# Patient Record
Sex: Female | Born: 1940
Health system: Southern US, Community
[De-identification: ages and names within clinical notes are randomized; demographics above are authoritative.]

## PROBLEM LIST (undated history)

## (undated) DIAGNOSIS — F411 Generalized anxiety disorder: Secondary | ICD-10-CM

## (undated) DIAGNOSIS — K589 Irritable bowel syndrome without diarrhea: Secondary | ICD-10-CM

## (undated) DIAGNOSIS — N390 Urinary tract infection, site not specified: Secondary | ICD-10-CM

## (undated) DIAGNOSIS — Z8701 Personal history of pneumonia (recurrent): Secondary | ICD-10-CM

## (undated) DIAGNOSIS — I059 Rheumatic mitral valve disease, unspecified: Secondary | ICD-10-CM

## (undated) DIAGNOSIS — M810 Age-related osteoporosis without current pathological fracture: Secondary | ICD-10-CM

## (undated) DIAGNOSIS — K209 Esophagitis, unspecified: Secondary | ICD-10-CM

## (undated) DIAGNOSIS — J069 Acute upper respiratory infection, unspecified: Secondary | ICD-10-CM

## (undated) HISTORY — PX: OTHER SURGICAL HISTORY: SHX169

## (undated) HISTORY — DX: Age-related osteoporosis without current pathological fracture: M81.0

## (undated) HISTORY — DX: Generalized anxiety disorder: F41.1

## (undated) HISTORY — DX: Rheumatic mitral valve disease, unspecified: I05.9

## (undated) HISTORY — PX: CATARACT EXTRACTION, BILATERAL: SHX1313

## (undated) HISTORY — DX: Personal history of pneumonia (recurrent): Z87.01

## (undated) HISTORY — DX: Acute upper respiratory infection, unspecified: J06.9

## (undated) HISTORY — DX: Esophagitis, unspecified: K20.9

## (undated) HISTORY — DX: Irritable bowel syndrome, unspecified: K58.9

## (undated) HISTORY — PX: MINOR EXCISION OF ORAL LESION: SHX6466

## (undated) HISTORY — DX: Gilbert syndrome: E80.4

## (undated) HISTORY — DX: Urinary tract infection, site not specified: N39.0

---

## 1998-08-06 HISTORY — PX: TOTAL ABDOMINAL HYSTERECTOMY W/ BILATERAL SALPINGOOPHORECTOMY: SHX83

## 2004-08-06 LAB — HM COLONOSCOPY: HM Colonoscopy: NORMAL

## 2006-07-11 ENCOUNTER — Encounter: Payer: Self-pay | Admitting: Internal Medicine

## 2006-08-21 ENCOUNTER — Encounter: Payer: Self-pay | Admitting: Internal Medicine

## 2006-08-21 LAB — HM MAMMOGRAPHY

## 2006-09-06 DIAGNOSIS — Z8701 Personal history of pneumonia (recurrent): Secondary | ICD-10-CM

## 2006-09-06 HISTORY — DX: Personal history of pneumonia (recurrent): Z87.01

## 2006-09-13 ENCOUNTER — Other Ambulatory Visit: Payer: Self-pay

## 2006-09-13 ENCOUNTER — Emergency Department: Payer: Self-pay | Admitting: Emergency Medicine

## 2007-03-20 ENCOUNTER — Ambulatory Visit: Payer: Self-pay | Admitting: Internal Medicine

## 2007-03-20 DIAGNOSIS — I059 Rheumatic mitral valve disease, unspecified: Secondary | ICD-10-CM | POA: Insufficient documentation

## 2007-03-20 DIAGNOSIS — F39 Unspecified mood [affective] disorder: Secondary | ICD-10-CM | POA: Insufficient documentation

## 2007-03-20 DIAGNOSIS — M810 Age-related osteoporosis without current pathological fracture: Secondary | ICD-10-CM | POA: Insufficient documentation

## 2007-03-24 ENCOUNTER — Encounter: Payer: Self-pay | Admitting: Internal Medicine

## 2007-04-28 ENCOUNTER — Encounter: Payer: Self-pay | Admitting: Internal Medicine

## 2007-06-30 DIAGNOSIS — N390 Urinary tract infection, site not specified: Secondary | ICD-10-CM | POA: Insufficient documentation

## 2007-07-07 ENCOUNTER — Ambulatory Visit: Payer: Self-pay | Admitting: Internal Medicine

## 2007-07-07 LAB — CONVERTED CEMR LAB
Glucose, Urine, Semiquant: NEGATIVE
Protein, U semiquant: NEGATIVE
Specific Gravity, Urine: <1.005
Urobilinogen, UA: 0.2

## 2007-07-17 ENCOUNTER — Encounter: Payer: Self-pay | Admitting: Internal Medicine

## 2007-07-28 ENCOUNTER — Ambulatory Visit: Payer: Self-pay | Admitting: Internal Medicine

## 2007-10-21 ENCOUNTER — Telehealth (INDEPENDENT_AMBULATORY_CARE_PROVIDER_SITE_OTHER): Payer: Self-pay | Admitting: *Deleted

## 2007-11-17 ENCOUNTER — Ambulatory Visit: Payer: Self-pay | Admitting: Family Medicine

## 2007-11-17 LAB — CONVERTED CEMR LAB
Bacteria, UA: 0
Bilirubin Urine: NEGATIVE
Blood in Urine, dipstick: NEGATIVE
Specific Gravity, Urine: <1.005
Urobilinogen, UA: 0.2
WBC Urine, dipstick: NEGATIVE
pH: 6.5

## 2007-11-18 ENCOUNTER — Encounter (INDEPENDENT_AMBULATORY_CARE_PROVIDER_SITE_OTHER): Payer: Self-pay | Admitting: Internal Medicine

## 2007-12-04 ENCOUNTER — Encounter: Payer: Self-pay | Admitting: Internal Medicine

## 2007-12-10 ENCOUNTER — Encounter (INDEPENDENT_AMBULATORY_CARE_PROVIDER_SITE_OTHER): Payer: Self-pay | Admitting: Internal Medicine

## 2007-12-16 ENCOUNTER — Encounter: Payer: Self-pay | Admitting: Internal Medicine

## 2008-03-15 ENCOUNTER — Ambulatory Visit: Payer: Self-pay | Admitting: Family Medicine

## 2008-03-15 LAB — CONVERTED CEMR LAB
Bilirubin Urine: NEGATIVE
Blood in Urine, dipstick: NEGATIVE
Glucose, Urine, Semiquant: NEGATIVE
Ketones, urine, test strip: NEGATIVE
Nitrite: NEGATIVE
Protein, U semiquant: NEGATIVE
Specific Gravity, Urine: <1.005
Urobilinogen, UA: 0.2
pH: 7.5

## 2008-03-19 ENCOUNTER — Telehealth: Payer: Self-pay | Admitting: Family Medicine

## 2008-03-20 ENCOUNTER — Encounter: Payer: Self-pay | Admitting: Family Medicine

## 2008-09-19 IMAGING — CR DG CHEST 2V
1 series · 2 of 2 positions shown · non-contrast
Comparison: none

REASON FOR EXAM: chest pain/cough
COMMENTS:

PROCEDURE:     DXR - DXR CHEST PA (OR AP) AND LATERAL  - September 14, 2006  [DATE]
RESULT:     The mediastinum and hilar structures are normal.  The lungs are
clear.  Biapical pleural thickening is noted consistent with scarring.

[Series 1: view not recorded · 0.17mm/px · 2 of 2 slices shown]
[im 1/2]
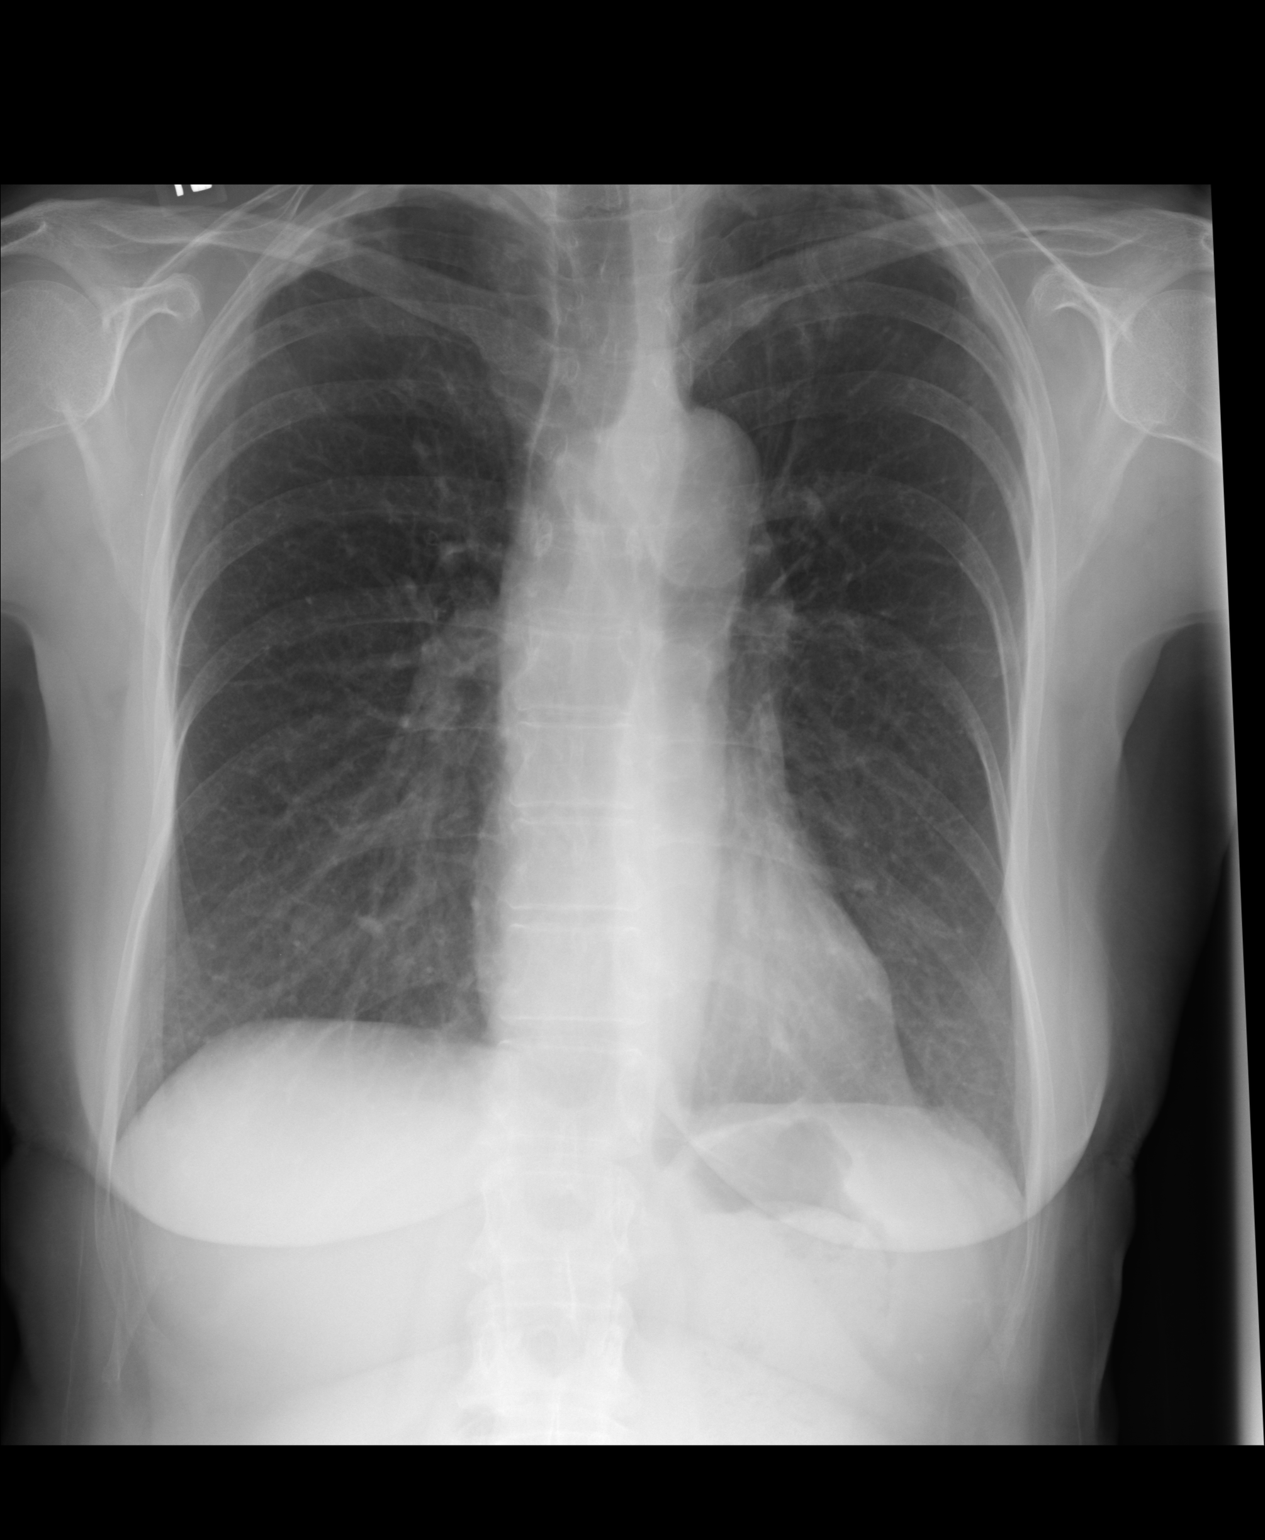
[im 2/2]
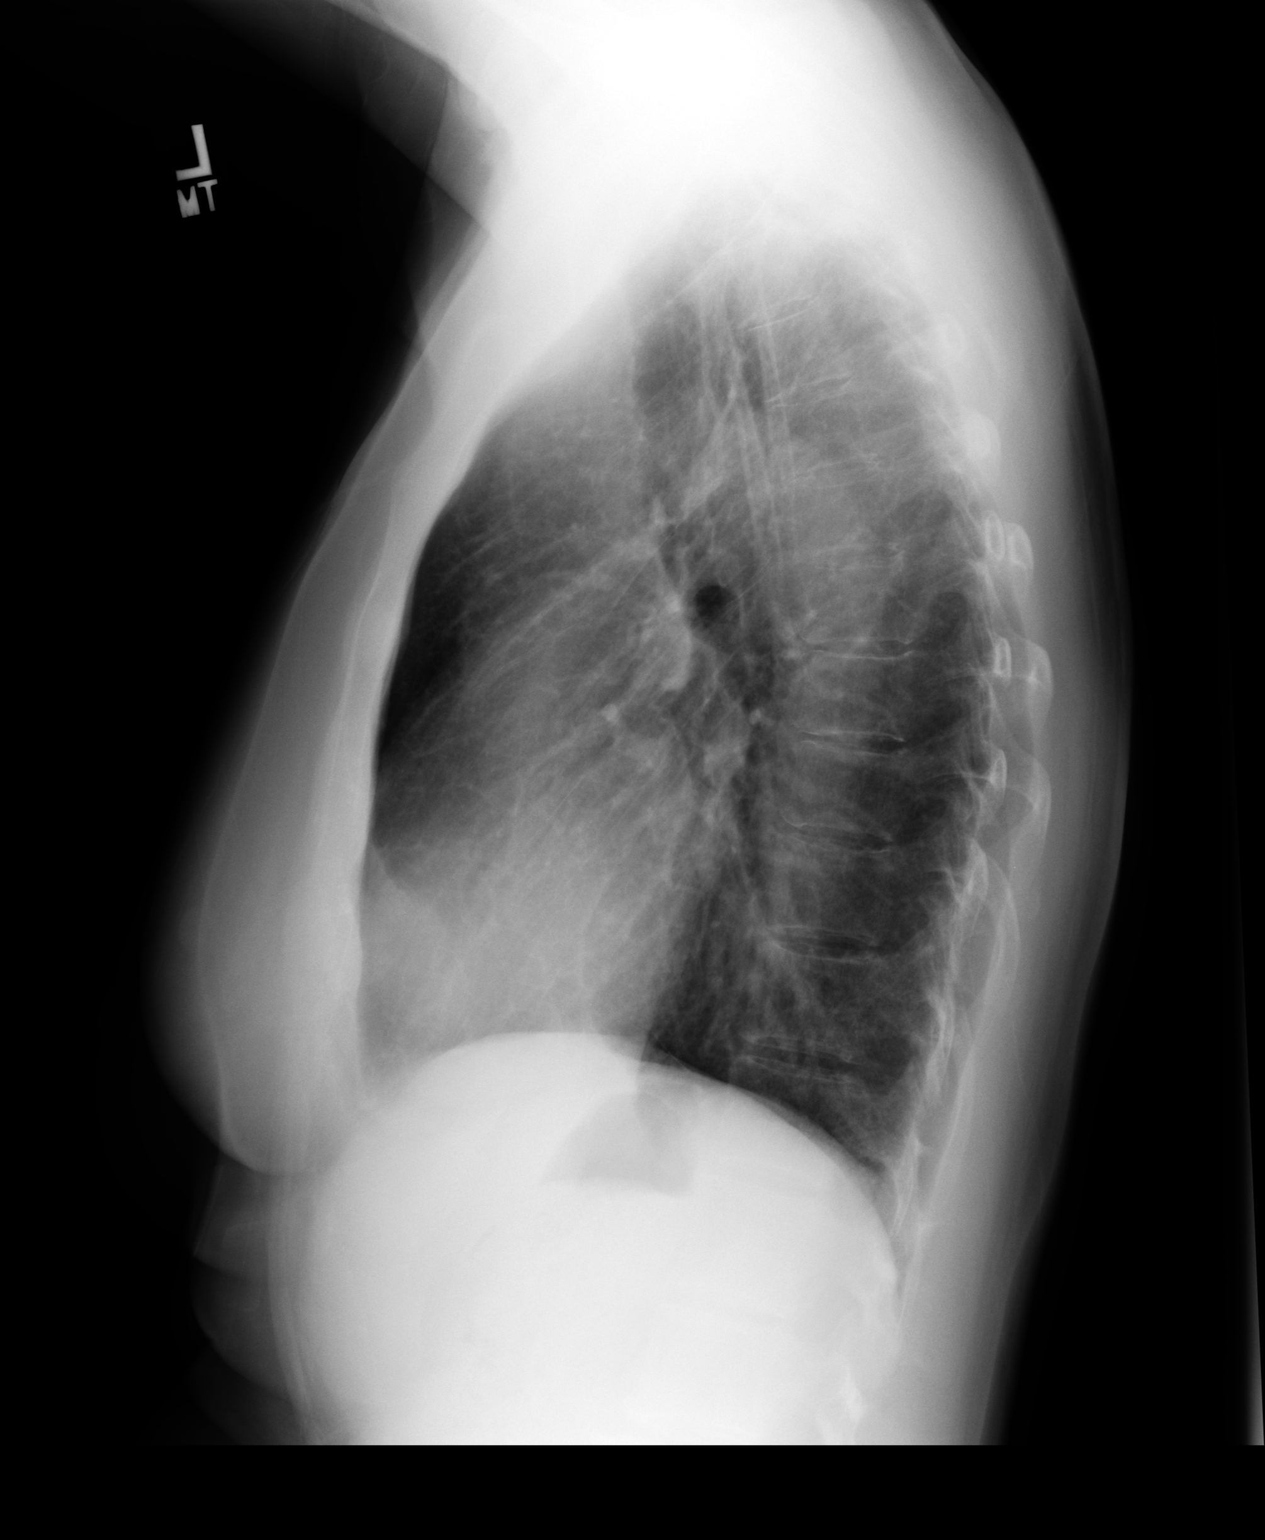

[2 of 2 positions shown; findings below may reference images not displayed]

IMPRESSION: No acute cardiopulmonary disease.

## 2009-03-01 ENCOUNTER — Ambulatory Visit: Payer: Self-pay | Admitting: Unknown Physician Specialty

## 2009-06-14 ENCOUNTER — Ambulatory Visit: Payer: Self-pay | Admitting: Family Medicine

## 2009-07-25 ENCOUNTER — Ambulatory Visit: Payer: Self-pay | Admitting: Internal Medicine

## 2009-07-25 DIAGNOSIS — J069 Acute upper respiratory infection, unspecified: Secondary | ICD-10-CM | POA: Insufficient documentation

## 2009-09-08 LAB — LIPID PANEL: LDL Cholesterol: 93 mg/dL

## 2010-05-12 ENCOUNTER — Telehealth: Payer: Self-pay | Admitting: Family Medicine

## 2010-09-05 NOTE — Progress Notes (Signed)
Summary: cold sores  Phone Note Call from Patient Call back at Home Phone [phone]   Caller: Patient Call For: Kerby Nora MD Summary of Call: Patient says that she has a few cold sores and is asking if she could get a rx for them called in to [facility] on [address]. Please advise.  Initial call taken by: Melody Comas,  May 12, 2010 9:22 AM  Follow-up for Phone Call        fever blisters on lips or cold sores in mouth ? Is she requesting a particular med? Follow-up by: Kerby Nora MD,  May 12, 2010 9:32 AM  Additional Follow-up for Phone Call Additional follow up Details #1::        Patient said that she has fever blisters on her lips and she is requesting zoviraz cream. She has used this in the past and really worked well for her.  Additional Follow-up by: Melody Comas,  May 12, 2010 11:11 AM    New/Updated Medications: ZOVIRAX 5 % OINT (ACYCLOVIR) Apply 6 times a day for 7 days Prescriptions: ZOVIRAX 5 % OINT (ACYCLOVIR) Apply 6 times a day for 7 days  #1 x 1   Entered and Authorized by:   Kerby Nora MD   Signed by:   Kerby Nora MD on 05/12/2010   Method used:   Electronically to        [facility] (retail)       [address], [address]       [place], [place]  [address]       Ph: [phone]       Fax: [phone]   RxID:   [id number]

## 2010-10-26 ENCOUNTER — Encounter: Payer: Self-pay | Admitting: Internal Medicine

## 2010-10-27 ENCOUNTER — Ambulatory Visit: Payer: Self-pay | Admitting: Family Medicine

## 2010-12-19 NOTE — Assessment & Plan Note (Signed)
[facility]                                 ON-CALL NOTE   NAME:Musto, SHADA                        MRN:          [id number]  DATE:07/06/2007                            DOB:          [birth year]    CALLER:  Patient's husband.   TIME OF CALL:  5:49pm   TELEPHONE NUMBER:  [phone]   REGULAR DOCTOR:  Dr. Alphonsus Sias.   CHIEF COMPLAINT:  UTI.   The patient's husband says that she has had a history of UTIs in the  past. They moved here recently from [place] and she had some old  Cipro left over. She is complaining of dysuria, frequency, and urgency.  There is no hematuria and no back pain, fever, nausea, or vomiting. She  has taken two days of Cipro and has not felt any relief yet and they  wanted to know what to do. I told them to go ahead and have her take the  third day of Cipro and then to call the office in the morning for follow  up, so she can give urine for a culture sensitivity and be seen. I  explained that she may have a urinary tract infection that is resistant  to Cipro, or just has not responded yet. I told her to increase her  fluid intake and that if she develops back pain, fever, nausea,  vomiting, or worsening symptoms tonight to go the emergency room.     Marne A. Tower, MD  Electronically Signed    MAT/MedQ  DD: 07/06/2007  DT: 07/07/2007  Job #: [id number]

## 2011-01-25 ENCOUNTER — Ambulatory Visit: Payer: Self-pay | Admitting: Ophthalmology

## 2011-04-10 ENCOUNTER — Ambulatory Visit: Payer: Self-pay | Admitting: Ophthalmology

## 2011-06-04 ENCOUNTER — Ambulatory Visit (INDEPENDENT_AMBULATORY_CARE_PROVIDER_SITE_OTHER): Payer: Medicare Other | Admitting: Family Medicine

## 2011-06-04 ENCOUNTER — Encounter: Payer: Self-pay | Admitting: Family Medicine

## 2011-06-04 VITALS — BP 146/84 | HR 88 | Temp 97.8°F | Wt 155.0 lb

## 2011-06-04 DIAGNOSIS — Z23 Encounter for immunization: Secondary | ICD-10-CM

## 2011-06-04 DIAGNOSIS — N390 Urinary tract infection, site not specified: Secondary | ICD-10-CM

## 2011-06-04 DIAGNOSIS — R300 Dysuria: Secondary | ICD-10-CM

## 2011-06-04 LAB — POCT URINALYSIS DIPSTICK
Bilirubin, UA: NEGATIVE
Nitrite, UA: NEGATIVE
Protein, UA: NEGATIVE
pH, UA: 6.5

## 2011-06-04 MED ORDER — SULFAMETHOXAZOLE-TRIMETHOPRIM 800-160 MG PO TABS: 1.0000 | ORAL_TABLET | Freq: Two times a day (BID) | ORAL | Status: AC

## 2011-06-04 NOTE — Patient Instructions (Signed)
Drink plenty of water and start the antibiotics today.  Take care.    

## 2011-06-04 NOTE — Progress Notes (Signed)
H/o frequent UTIs.  Prev on macrobid in 11/2010 Dysuria: yes, frequency, minimal voiding per episode, pain with urination duration of symptoms: 4-5 days abdominal pain: no  fevers:no back pain:no vomiting:no  Meds, vitals, and allergies reviewed.   ROS: See HPI.  Otherwise negative.    GEN: nad, alert and oriented HEENT: mucous membranes moist NECK: supple CV: rrr.  PULM: ctab, no inc wob ABD: soft, +bs, suprapubic area not tender EXT: no edema SKIN: no acute rash BACK: no CVA pain

## 2011-06-05 ENCOUNTER — Telehealth: Payer: Self-pay | Admitting: *Deleted

## 2011-06-05 NOTE — Telephone Encounter (Signed)
This should be okay to take.

## 2011-06-05 NOTE — Telephone Encounter (Signed)
Advised pt

## 2011-06-05 NOTE — Assessment & Plan Note (Signed)
Septra, ucx and fluids.  Nontoxic, no sign of pyelo, f/u prn.  See notes on labs.

## 2011-06-05 NOTE — Telephone Encounter (Signed)
Pt was seen yesterday and given septra for a UTI.  She was also given a flu shot and asks if ok to take the septra since the just got the vaccine.  Her septra patient information sheet said not to take it if she has had recent vaccines.

## 2011-06-08 ENCOUNTER — Encounter: Payer: Self-pay | Admitting: Internal Medicine

## 2011-06-08 LAB — URINE CULTURE

## 2011-06-18 ENCOUNTER — Telehealth: Payer: Self-pay | Admitting: *Deleted

## 2011-06-18 NOTE — Telephone Encounter (Signed)
Pt's husband called back, said it's time for pt to follow up with urologist, she will make appt there.

## 2011-06-18 NOTE — Telephone Encounter (Signed)
noted 

## 2011-06-18 NOTE — Telephone Encounter (Signed)
Pt has been taking amox for UTI but her husband says this isnt helping. She is still having symptoms- feeling like she's not emptying her bladder, feels like she has to go all the time, feels irritated.  Pt does have a urologist.  Please advise on what she should do. Uses [facility] [address].

## 2011-08-21 ENCOUNTER — Ambulatory Visit: Payer: Self-pay | Admitting: Ophthalmology

## 2011-10-31 ENCOUNTER — Ambulatory Visit: Payer: Medicare Other | Admitting: Cardiovascular Disease

## 2011-11-21 ENCOUNTER — Ambulatory Visit (INDEPENDENT_AMBULATORY_CARE_PROVIDER_SITE_OTHER): Payer: Medicare Other | Admitting: Internal Medicine

## 2011-11-21 ENCOUNTER — Encounter: Payer: Self-pay | Admitting: Internal Medicine

## 2011-11-21 VITALS — BP 128/80 | HR 80 | Temp 97.9°F | Ht 65.0 in | Wt 145.0 lb

## 2011-11-21 DIAGNOSIS — Z0000 Encounter for general adult medical examination without abnormal findings: Secondary | ICD-10-CM | POA: Insufficient documentation

## 2011-11-21 DIAGNOSIS — N390 Urinary tract infection, site not specified: Secondary | ICD-10-CM

## 2011-11-21 DIAGNOSIS — I059 Rheumatic mitral valve disease, unspecified: Secondary | ICD-10-CM

## 2011-11-21 DIAGNOSIS — M810 Age-related osteoporosis without current pathological fracture: Secondary | ICD-10-CM

## 2011-11-21 DIAGNOSIS — F411 Generalized anxiety disorder: Secondary | ICD-10-CM

## 2011-11-21 DIAGNOSIS — K589 Irritable bowel syndrome without diarrhea: Secondary | ICD-10-CM

## 2011-11-21 LAB — CBC WITH DIFFERENTIAL/PLATELET
Basophils Absolute: 0.1 10*3/uL (ref 0.0–0.1)
Eosinophils Absolute: 0.1 10*3/uL (ref 0.0–0.7)
Lymphocytes Relative: 29.8 % (ref 12.0–46.0)
MCHC: 32.7 g/dL (ref 30.0–36.0)
Neutrophils Relative %: 59.9 % (ref 43.0–77.0)
Platelets: 226 10*3/uL (ref 150.0–400.0)
RBC: 4.56 Mil/uL (ref 3.87–5.11)
RDW: 14.1 % (ref 11.5–14.6)

## 2011-11-21 LAB — BASIC METABOLIC PANEL
CO2: 27 mEq/L (ref 19–32)
Chloride: 106 mEq/L (ref 96–112)
Potassium: 4 mEq/L (ref 3.5–5.1)
Sodium: 142 mEq/L (ref 135–145)

## 2011-11-21 LAB — HEPATIC FUNCTION PANEL
Bilirubin, Direct: 0.1 mg/dL (ref 0.0–0.3)
Total Bilirubin: 1 mg/dL (ref 0.3–1.2)

## 2011-11-21 LAB — TSH: TSH: 1.72 u[IU]/mL (ref 0.35–5.50)

## 2011-11-21 NOTE — Assessment & Plan Note (Signed)
Palpitations are not as evident Feeling of fast heart at times No click or murmur on exam Plans to see cardiologist

## 2011-11-21 NOTE — Progress Notes (Signed)
  Subjective:    Patient ID: Denise Rhodes, female    DOB: 1940/12/23, 71 y.o.   MRN: [id number]  HPI Here for Medicare wellness No depression or anhedonia No falls or instability Reviewed advanced directives Mild hearing problems---no real functional issues  Has had some anxiety Normal life stress does give her some anxiety issues (more than average) Has used alprazolam rarely  Has mitral valve prolapse Husband concerned about her blood pressure--but normal here No palpitations --did have years ago Does note fast pulse rate at times---then doesn't feel right. Rate in 90's No dizziness or syncope No edema  Unable to tolerate calcium in pills or much dairy due to IBS Not exercising--discussed starting to walk  No current outpatient prescriptions on file prior to visit.    No Known Allergies  Past Medical History  Diagnosis Date  . Anxiety state, unspecified   . Mitral valve disorders   . Osteoporosis, unspecified   . Acute upper respiratory infections of unspecified site   . Urinary tract infection, site not specified   . Gilbert's syndrome   . H/O: pneumonia 09/2006    Past Surgical History  Procedure Date  . Total abdominal hysterectomy w/ bilateral salpingoophorectomy 2000  . Cataract extraction, bilateral 9/12, 1/13    Family History  Problem Relation Age of Onset  . Cancer Mother     Colon  . Cancer Father     Lung  . Diabetes      Paternal side  . Coronary artery disease Maternal Grandmother   . Coronary artery disease Paternal Grandmother   . Hypertension Mother     History   Social History  . Marital Status: Married    Spouse Name: N/A    Number of Children: 3  . Years of Education: N/A   Occupational History  . 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 Smoking status: Never Smoker   . Smokeless tobacco: Never Used  . Alcohol Use: Yes     Rare  . Drug Use: Not on file  . Sexually Active: Not on file   Other  Topics Concern  . Not on file   Social History Narrative   No living will Requests husband as health care POAWould accept resuscitation attempts.Would not want tube feeds if cognitively unaware   Review of Systems Sleeps fairly well--best 4-8AM Weight is stable Bowels are variable--upset stomach at times. Better with citrucel at night and proper eating     Objective:   Physical Exam  Constitutional: She is oriented to person, place, and time. She appears well-developed and well-nourished. No distress.  Neck: Normal range of motion. Neck supple. No thyromegaly present.  Cardiovascular: Normal rate, regular rhythm, normal heart sounds and intact distal pulses.  Exam reveals no gallop.   No murmur heard. Pulmonary/Chest: Effort normal and breath sounds normal. No respiratory distress. She has no wheezes. She has no rales.  Abdominal: Soft. Bowel sounds are normal. There is no tenderness.  Musculoskeletal: She exhibits no edema and no tenderness.  Lymphadenopathy:    She has no cervical adenopathy.  Neurological: She is alert and oriented to person, place, and time.       Pollie Friar, [place]" [phone] D-[id number] Recall 3/3  Psychiatric: She has a normal mood and affect. Her behavior is normal. Thought content normal.          Assessment & Plan:

## 2011-11-21 NOTE — Assessment & Plan Note (Signed)
Ongoing issue Does well without meds generally Rare xanax

## 2011-11-21 NOTE — Assessment & Plan Note (Signed)
Asked her to start walking and take vitamin D Can't tolerate calcium

## 2011-11-21 NOTE — Assessment & Plan Note (Signed)
I have personally reviewed the Medicare Annual Wellness questionnaire and have noted 1. The patient's medical and social history 2. Their use of alcohol, tobacco or illicit drugs 3. Their current medications and supplements 4. The patient's functional ability including ADL's, fall risks, home safety risks and hearing or visual             impairment. 5. Diet and physical activities 6. Evidence for depression or mood disorders  The patients weight, height, BMI and visual acuity have been recorded in the chart I have made referrals, counseling and provided education to the patient based review of the above and I have provided the pt with a written personalized care plan for preventive services.  I have provided you with a copy of your personalized plan for preventive services. Please take the time to review along with your updated medication list.  Will defer zostavax--had shingles last August Gets mammos through gyn

## 2011-11-21 NOTE — Assessment & Plan Note (Signed)
Better with some dietary changes

## 2011-11-21 NOTE — Assessment & Plan Note (Signed)
Recurrent  Sees gyn specialist

## 2011-11-26 ENCOUNTER — Encounter: Payer: Self-pay | Admitting: *Deleted

## 2011-12-03 ENCOUNTER — Ambulatory Visit: Payer: Medicare Other | Admitting: Cardiovascular Disease

## 2011-12-21 ENCOUNTER — Ambulatory Visit: Payer: Medicare Other | Admitting: Cardiovascular Disease

## 2012-01-15 ENCOUNTER — Ambulatory Visit (INDEPENDENT_AMBULATORY_CARE_PROVIDER_SITE_OTHER): Payer: Medicare Other | Admitting: Cardiovascular Disease

## 2012-01-15 ENCOUNTER — Encounter: Payer: Self-pay | Admitting: Cardiovascular Disease

## 2012-01-15 VITALS — BP 118/66 | HR 76 | Ht 65.0 in | Wt 147.8 lb

## 2012-01-15 DIAGNOSIS — R002 Palpitations: Secondary | ICD-10-CM

## 2012-01-15 DIAGNOSIS — I059 Rheumatic mitral valve disease, unspecified: Secondary | ICD-10-CM

## 2012-01-15 DIAGNOSIS — I341 Nonrheumatic mitral (valve) prolapse: Secondary | ICD-10-CM

## 2012-01-15 NOTE — Assessment & Plan Note (Signed)
Rare palpitations. Will plan further workup if frequency changes.

## 2012-01-15 NOTE — Progress Notes (Signed)
   History of Present Illness: 71 yo WF with history of anxiety,  mitral valve prolapse, IBS who is here today for evaluation of palpitations. She tells me that she was diagnosed with mitral valve prolapse many years ago. She tells me that she had some heart burn 2 months ago but this has resolved. She does feel her heart racing at times every few months. No SOB. No chest pain over last few months.   Primary Care Physician: Dr. Alphonsus Sias  Past Medical History  Diagnosis Date  . Anxiety state, unspecified   . Mitral valve disorders   . Osteoporosis, unspecified   . Acute upper respiratory infections of unspecified site   . Urinary tract infection, site not specified   . Gilbert's syndrome   . H/O: pneumonia 09/2006  . IBS (irritable bowel syndrome)     Past Surgical History  Procedure Date  . Total abdominal hysterectomy w/ bilateral salpingoophorectomy 2000  . Cataract extraction, bilateral 9/12, 1/13  . Bladder tack     Current Outpatient Prescriptions  Medication Sig Dispense Refill  . ALPRAZolam (XANAX) 0.25 MG tablet Take 0.25 mg by mouth 3 (three) times daily as needed.      . magnesium 30 MG tablet Take 250 mg by mouth daily.        No Known Allergies  History   Social History  . Marital Status: Married    Spouse Name: N/A    Number of Children: 3  . Years of Education: N/A   Occupational History  . 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 Smoking status: Never Smoker   . Smokeless tobacco: Never Used  . Alcohol Use: No     Rare  . Drug Use: No  . Sexually Active: Not on file   Other Topics Concern  . Not on file   Social History Narrative   No living will Requests husband as health care POAWould accept resuscitation attempts.Would not want tube feeds if cognitively unaware    Family History  Problem Relation Age of Onset  . Cancer Mother     Colon  . Cancer Father     Lung  . Diabetes      Paternal side  . Coronary  artery disease Maternal Grandmother   . Coronary artery disease Paternal Grandmother   . Hypertension Mother     Review of Systems:  As stated in the HPI and otherwise negative.   BP 118/66  Pulse 76  Ht 5\' 5"  (1.651 m)  Wt 147 lb 12.8 oz (67.042 kg)  BMI 24.60 kg/m2  Physical Examination: General: Well developed, well nourished, NAD HEENT: OP clear, mucus membranes moist SKIN: warm, dry. No rashes. Neuro: No focal deficits Musculoskeletal: Muscle strength 5/5 all ext Psychiatric: Mood and affect normal Neck: No JVD, no carotid bruits, no thyromegaly, no lymphadenopathy. Lungs:Clear bilaterally, no wheezes, rhonci, crackles Cardiovascular: Regular rate and rhythm. No murmurs, gallops or rubs. Abdomen:Soft. Bowel sounds present. Non-tender.  Extremities: No lower extremity edema. Pulses are 2 + in the bilateral DP/PT.  EKG: NSR, rate 76 bpm.

## 2012-01-15 NOTE — Patient Instructions (Signed)

## 2012-01-15 NOTE — Assessment & Plan Note (Signed)
Will get echo to evaluate. No further workup.

## 2012-01-22 ENCOUNTER — Ambulatory Visit (HOSPITAL_COMMUNITY): Payer: Medicare Other | Attending: Cardiovascular Disease | Admitting: Radiology

## 2012-01-22 DIAGNOSIS — I519 Heart disease, unspecified: Secondary | ICD-10-CM | POA: Insufficient documentation

## 2012-01-22 DIAGNOSIS — R002 Palpitations: Secondary | ICD-10-CM | POA: Insufficient documentation

## 2012-01-22 DIAGNOSIS — I059 Rheumatic mitral valve disease, unspecified: Secondary | ICD-10-CM

## 2012-01-22 DIAGNOSIS — I341 Nonrheumatic mitral (valve) prolapse: Secondary | ICD-10-CM

## 2012-01-22 NOTE — Progress Notes (Signed)
Echocardiogram performed.  

## 2012-06-02 ENCOUNTER — Ambulatory Visit (INDEPENDENT_AMBULATORY_CARE_PROVIDER_SITE_OTHER): Payer: Medicare Other

## 2012-06-02 DIAGNOSIS — Z23 Encounter for immunization: Secondary | ICD-10-CM

## 2013-05-15 ENCOUNTER — Ambulatory Visit (INDEPENDENT_AMBULATORY_CARE_PROVIDER_SITE_OTHER): Payer: Medicare Other

## 2013-05-15 ENCOUNTER — Ambulatory Visit: Payer: Medicare Other

## 2013-05-15 DIAGNOSIS — Z23 Encounter for immunization: Secondary | ICD-10-CM

## 2013-05-21 ENCOUNTER — Ambulatory Visit (INDEPENDENT_AMBULATORY_CARE_PROVIDER_SITE_OTHER): Payer: Medicare Other | Admitting: Physician Assistant

## 2013-05-21 ENCOUNTER — Telehealth: Payer: Self-pay | Admitting: Cardiovascular Disease

## 2013-05-21 ENCOUNTER — Encounter: Payer: Self-pay | Admitting: Physician Assistant

## 2013-05-21 VITALS — BP 161/94 | HR 99 | Ht 65.0 in | Wt 149.2 lb

## 2013-05-21 DIAGNOSIS — R002 Palpitations: Secondary | ICD-10-CM

## 2013-05-21 DIAGNOSIS — R000 Tachycardia, unspecified: Secondary | ICD-10-CM

## 2013-05-21 DIAGNOSIS — I059 Rheumatic mitral valve disease, unspecified: Secondary | ICD-10-CM

## 2013-05-21 MED ORDER — PROPRANOLOL HCL 40 MG PO TABS: 40.0000 mg | ORAL_TABLET | ORAL | Status: DC | PRN

## 2013-05-21 MED ORDER — PROPRANOLOL HCL 20 MG PO TABS: 20.0000 mg | ORAL_TABLET | ORAL | Status: DC | PRN

## 2013-05-21 NOTE — Assessment & Plan Note (Signed)
The patient reports increased frequency and prolonged duration of palpitations over the past week. Will check CBC, CMET and TFTs to evaluate for secondary causes. Start propanolol PRN. Arrange 48 hour Holter monitor. Guide management based on findings. EKG indicates an isolated PAC today. Palpitations may be attributed to ectopy. No significant structural cardiac changes on echo last year. Suspect anxiety is playing a significant role.

## 2013-05-21 NOTE — Telephone Encounter (Signed)
New message    Last night pulse dropped to 50 and  B/p was 166/95.  Pulse is normally in the 80's.  Husband needs advice.

## 2013-05-21 NOTE — Progress Notes (Signed)
Patient ID: Denise Rhodes, female   DOB: [birth year], 72 y.o.   MRN: [id number]   Date:  05/21/2013   ID:  Denise Rhodes, DOB 08-10-1940, MRN [id number]  PCP:  Tillman Abide, MD  Primary Cardiologist:  C. Clifton James, MD   History of Present Illness:  Denise Rhodes is a 72 y.o. female w/ PMHx s/f anxiety, IBS and palpitations who presents today c/o worsening palpitations.  She followed up with Dr. Clifton James 01/2012 for palpitations. She reported a history of MVP at that time diagnosed many years prior. Follow-up echo indicated EF 55-60%, grade 1 diastolic dysfunction, structurally normal MV with normal leaflet separation, trivial MR. Further work-up for palpitations if change in frequency recommended.  She has not followed up since. She presents to the office today complaining of both tachy-palpitations and palpitations described as "skipping a beat" occurring more frequently (almost daily) and for longer periods over the past week. She personally relates this, at least partially, to anxiety. She denies increased stress. No excessive EtOH or caffeine use. Denies fevers, chills, abnormal bleeding. No h/o thyroid abnormalities, anemia or sleep apnea. Denies chest pain, SOB/DOE, PND, orthopnea, LE edema, weight gain, lightheadedness or syncope.   EKG: NSR, isolated PAC, borderline LAE, no ST/T changes  Wt Readings from Last 3 Encounters:  05/21/13 149 lb 4 oz (67.699 kg)  01/15/12 147 lb 12.8 oz (67.042 kg)  11/21/11 145 lb (65.772 kg)     Past Medical History  Diagnosis Date  . Anxiety state, unspecified   . Mitral valve disorders   . Osteoporosis, unspecified   . Acute upper respiratory infections of unspecified site   . Urinary tract infection, site not specified   . Gilbert's syndrome   . H/O: pneumonia 09/2006  . IBS (irritable bowel syndrome)     Current Outpatient Prescriptions  Medication Sig Dispense Refill  . ALPRAZolam (XANAX) 0.25 MG tablet Take 0.25 mg by mouth 3 (three) times  daily as needed.      . magnesium 30 MG tablet Take 250 mg by mouth daily.       No current facility-administered medications for this visit.    Allergies:   No Known Allergies  Social History:  The patient  reports that she has never smoked. She has never used smokeless tobacco. She reports that she does not drink alcohol or use illicit drugs.   Family History:  Family History  Problem Relation Age of Onset  . Cancer Mother     Colon  . Cancer Father     Lung  . Diabetes      Paternal side  . Coronary artery disease Maternal Grandmother   . Coronary artery disease Paternal Grandmother   . Hypertension Mother     Review of Systems: General: negative for chills, fever, night sweats or weight changes.  Cardiovascular: positive for palpitations, negative for chest pain, dyspnea on exertion, edema, orthopnea, paroxysmal nocturnal dyspnea or shortness of breath Dermatological: negative for rash Respiratory: negative for cough or wheezing Urologic: negative for hematuria Abdominal: negative for nausea, vomiting, diarrhea, bright red blood per rectum, melena, or hematemesis Neurologic: negative for visual changes, syncope, or dizziness All other systems reviewed and are otherwise negative except as noted above.  PHYSICAL EXAM: VS:  BP 161/94  Pulse 99  Ht 5\' 5"  (1.651 m)  Wt 149 lb 4 oz (67.699 kg)  BMI 24.84 kg/m2 Well nourished, well developed, in no acute distress. HEENT: normal, PERRL Neck: no JVD or bruits Cardiac:  normal S1, S2;  RRR; no murmur, clicks or gallops; no extra-systoles Lungs:  clear to auscultation bilaterally, no wheezing, rhonchi or rales Abd: soft, nontender, no hepatomegaly, normoactive BS x 4 quads Ext: no edema, cyanosis or clubbing Skin: warm and dry, cap refill < 2 sec Neuro:  CNs 2-12 intact, no focal abnormalities noted Musculoskeletal: strength and tone appropriate for age  Psych: anxious appearing, pressured speech at times

## 2013-05-21 NOTE — Telephone Encounter (Signed)
Agree. cdm 

## 2013-05-21 NOTE — Telephone Encounter (Signed)
Spoke with pt's husband. He reports pt felt tired last night and felt like she had a fast pulse rate. He said when he checked her heart rate it was 50 and blood pressure was 170/95. Later checked and it was 138/82 with pulse of 84.  Husband reports wife has had a few episodes of feeling like pulse is elevated recently. She took tranquilizer last night and slept well.  She is feeling good this morning. Has not checked blood pressure this morning. Does not feel like heart rate is elevated. Pt has appt with Dr. Clifton James on May 27, 2013.  I advised husband that pt should keep this appt and to let us know if episodes increase prior to this appt. Instructed to go to ED if symptoms worsen.

## 2013-05-21 NOTE — Patient Instructions (Addendum)
Please take propranolol as prescribed. Please monitor your BP with this.   We will obtain labwork today as discussed to check for secondary causes for your palpitations.   Continue to take Xanax as needed for anxiety.   We will arrange 48 hour Holter monitoring to check for arrhythmia.   We will see you back as needed after reviewing the Holter findings.

## 2013-05-21 NOTE — Assessment & Plan Note (Signed)
No evidence of click on exam. Normal MV on echo last year.

## 2013-05-22 ENCOUNTER — Telehealth: Payer: Self-pay

## 2013-05-22 ENCOUNTER — Telehealth: Payer: Self-pay | Admitting: *Deleted

## 2013-05-22 LAB — COMPREHENSIVE METABOLIC PANEL
AST: 15 IU/L (ref 0–40)
Albumin/Globulin Ratio: 1.9 (ref 1.1–2.5)
Albumin: 4.2 g/dL (ref 3.5–4.8)
BUN: 14 mg/dL (ref 8–27)
CO2: 25 mmol/L (ref 18–29)
Calcium: 9.4 mg/dL (ref 8.6–10.2)
Creatinine, Ser: 0.68 mg/dL (ref 0.57–1.00)
GFR calc Af Amer: 101 mL/min/{1.73_m2} (ref >59)
Globulin, Total: 2.2 g/dL (ref 1.5–4.5)
Glucose: 169 mg/dL — ABNORMAL HIGH (ref 65–99)
Potassium: 4.5 mmol/L (ref 3.5–5.2)
Sodium: 143 mmol/L (ref 134–144)
Total Bilirubin: 1 mg/dL (ref 0.0–1.2)
Total Protein: 6.4 g/dL (ref 6.0–8.5)

## 2013-05-22 LAB — T4, FREE: Free T4: 1.37 ng/dL (ref 0.82–1.77)

## 2013-05-22 LAB — CBC
Platelets: 262 10*3/uL (ref 150–379)
RBC: 4.43 x10E6/uL (ref 3.77–5.28)
RDW: 13.4 % (ref 12.3–15.4)
WBC: 6.9 10*3/uL (ref 3.4–10.8)

## 2013-05-22 NOTE — Telephone Encounter (Signed)
Message copied by Marilynne Halsted on Fri May 22, 2013  3:00 PM ------      Message from: Denise Rhodes      Created: Fri May 22, 2013  9:52 AM       CMET, CBC and TFTs all WNL. Please update patient. Thx ------

## 2013-05-22 NOTE — Telephone Encounter (Signed)
Line busy. Will try again.

## 2013-05-22 NOTE — Telephone Encounter (Signed)
They have a question about the instructions for the medication that was prescribed yesterday.  Please call the daughter back at [phone]

## 2013-05-22 NOTE — Telephone Encounter (Signed)
Spoke w/ pt's husband.  He will make pt aware of results and have her call with any questions or concerns.

## 2013-05-22 NOTE — Telephone Encounter (Signed)
Spoke w/ pt's husband.  States that they picked up rx from pharmacy and were given 2 strengths of propranolol: 20mg  & 40mg . Informed him that our notes indicate that pt is on 20 mg and I have no record of propranolol 40mg . He will contact the pharmacy and let me know if I need to speak with them.

## 2013-05-27 ENCOUNTER — Ambulatory Visit: Payer: Medicare Other | Admitting: Cardiovascular Disease

## 2013-06-01 ENCOUNTER — Telehealth: Payer: Self-pay

## 2013-06-01 NOTE — Telephone Encounter (Signed)
Pt husband would like holter monitor results.

## 2013-06-02 NOTE — Telephone Encounter (Signed)
I spoke with Denise Rhodes in the [place] office. She states she has the patient's monitor to be downloaded. I spoke with the patient's husband and he is aware. I advised we will call him back when the holter is received. Per the patient's husband, she experiences "symptoms" about 2:30- 3:00 pm every day and has to take a propranolol. He states the patient did have the same symptoms but later in the day while wearing her monitor. She documented this in her log.

## 2013-06-05 ENCOUNTER — Telehealth: Payer: Self-pay | Admitting: Cardiovascular Disease

## 2013-06-05 ENCOUNTER — Telehealth: Payer: Self-pay | Admitting: *Deleted

## 2013-06-05 DIAGNOSIS — R002 Palpitations: Secondary | ICD-10-CM

## 2013-06-05 MED ORDER — METOPROLOL TARTRATE 25 MG PO TABS: 25.0000 mg | ORAL_TABLET | Freq: Two times a day (BID) | ORAL | Status: DC

## 2013-06-05 NOTE — Telephone Encounter (Signed)
Holter monitor (prelim) was reviewed. This shows normal sinus rhythm with frequent APCs, sometimes in a bigeminal pattern, rare short episodes of atrial tachycardia, one where short episode of idioventricular rhythm 6 beats The fastest heart rate any tachycardia episodes is 130 beats per minute  Would start metoprolol 25 mg twice a day, a.m. and p.m.  Would use propranolol for breakthrough episodes of tachycardia If she continues to use frequent Propranolol for breakthrough, We may need to increase the metoprolol dosing either up to 1 1/2 pills twice a day (37.5 mg), or up to 50 mm twice a day Would increase slowly to avoid slow heart rate.  With scheduled followup in clinic If she continues to have frequent episodes despite beta blockers, may need a 30 day monitor for further tracking Pills can be adjusted while she is on a 30 day monitor

## 2013-06-05 NOTE — Telephone Encounter (Signed)
Left detailed message on home answering machine with instructions for metoprolol and propanolol and that I would call them back on Monday with further instructions.  

## 2013-06-05 NOTE — Telephone Encounter (Signed)
Husband call and his wife's pulse is up and down and would like to know his wife's 24 hr monitor results.

## 2013-06-05 NOTE — Telephone Encounter (Signed)
Left detailed message on home answering machine with instructions for metoprolol and propanolol and that I would call them back on Monday with further instructions.

## 2013-06-05 NOTE — Addendum Note (Signed)
Addended by: Rhea Belton R on: 06/05/2013 05:27 PM   Modules accepted: Orders

## 2013-06-08 ENCOUNTER — Telehealth: Payer: Self-pay

## 2013-06-08 NOTE — Telephone Encounter (Signed)
Spoke w/ pt and husband.  Reviewed results of holter.   Pt states that she is feeling much better and has not had any "episodes" since starting the metoprolol. Reports a brief episode of nausea, but states that she believes this was due to taking meds on an empty stomach.  Pt sched to see Dr. Mariah Milling 06/10/13 @ 10:45 to review results in detail.

## 2013-06-08 NOTE — Telephone Encounter (Signed)
Spoke with patient and advised results   

## 2013-06-08 NOTE — Telephone Encounter (Signed)
Pt has appt for CPX on 06/09/13 at 9:30 am, pt is not eating breakfast in AM before appt and pt takes metoprolol with food; is it OK for pt to take metoprolol after appt.Please advise.

## 2013-06-08 NOTE — Telephone Encounter (Signed)
yes

## 2013-06-09 ENCOUNTER — Ambulatory Visit (INDEPENDENT_AMBULATORY_CARE_PROVIDER_SITE_OTHER): Payer: Medicare Other | Admitting: Internal Medicine

## 2013-06-09 ENCOUNTER — Encounter: Payer: Self-pay | Admitting: Internal Medicine

## 2013-06-09 ENCOUNTER — Encounter (INDEPENDENT_AMBULATORY_CARE_PROVIDER_SITE_OTHER): Payer: Medicare Other

## 2013-06-09 VITALS — BP 138/80 | HR 63 | Temp 98.1°F | Ht 65.0 in | Wt 146.0 lb

## 2013-06-09 DIAGNOSIS — M810 Age-related osteoporosis without current pathological fracture: Secondary | ICD-10-CM

## 2013-06-09 DIAGNOSIS — Z0000 Encounter for general adult medical examination without abnormal findings: Secondary | ICD-10-CM

## 2013-06-09 DIAGNOSIS — R002 Palpitations: Secondary | ICD-10-CM

## 2013-06-09 DIAGNOSIS — R7309 Other abnormal glucose: Secondary | ICD-10-CM | POA: Insufficient documentation

## 2013-06-09 DIAGNOSIS — R000 Tachycardia, unspecified: Secondary | ICD-10-CM

## 2013-06-09 DIAGNOSIS — F411 Generalized anxiety disorder: Secondary | ICD-10-CM

## 2013-06-09 LAB — GLUCOSE, RANDOM: Glucose, Bld: 92 mg/dL (ref 70–99)

## 2013-06-09 NOTE — Assessment & Plan Note (Signed)
Was not fasting Will just recheck

## 2013-06-09 NOTE — Assessment & Plan Note (Signed)
Will try to get DEXA report On vitamin D

## 2013-06-09 NOTE — Assessment & Plan Note (Signed)
Life long issue Uses the alprazolam fairly rarely

## 2013-06-09 NOTE — Assessment & Plan Note (Signed)
Just had holter and put on beta blocker ?SVT Seeing cardiologist tomorrow

## 2013-06-09 NOTE — Progress Notes (Signed)
Subjective:    Patient ID: Denise Rhodes, female    DOB: [birth year], 72 y.o.   MRN: [id number]  HPI Here for Medicare wellness visit and follow up Here with husband No falls No depression or anhedonia Completely independent with instrumental ADLs No problems with vision or hearing Has noticed some troubles with short term memory--- forgets what she went into a room to get if she gets distracted No alcohol or tobacco  Recent problems with palpitations Had cardiology evaluation and just had holter Metoprolol just started and meeting with cardiologist tomorrow Labs were okay but non fasting glucose was elevated---needs repeat No chest pain No SOB or DOE  Reviewed osteoporosis diagnosis Will try to get DEXA results Just started vitamin D Walks some with her husband  Anxiety has worsened lately Upset about the heart thing Gets relief from the xanax--- but rarely uses it (0-4 times per month in general)  Current Outpatient Prescriptions on File Prior to Visit  Medication Sig Dispense Refill  . ALPRAZolam (XANAX) 0.25 MG tablet Take 0.25 mg by mouth 3 (three) times daily as needed.      . magnesium 30 MG tablet Take 250 mg by mouth daily.      . metoprolol tartrate (LOPRESSOR) 25 MG tablet Take 1 tablet (25 mg total) by mouth 2 (two) times daily.  180 tablet  3  . propranolol (INDERAL) 20 MG tablet Take 1 tablet (20 mg total) by mouth as needed (for heart palpitations).  60 tablet  3   No current facility-administered medications on file prior to visit.    No Known Allergies  Past Medical History  Diagnosis Date  . Anxiety state, unspecified   . Mitral valve disorders   . Osteoporosis, unspecified   . Acute upper respiratory infections of unspecified site   . Urinary tract infection, site not specified   . Gilbert's syndrome   . H/O: pneumonia 09/2006  . IBS (irritable bowel syndrome)     Past Surgical History  Procedure Laterality Date  . Total abdominal hysterectomy  w/ bilateral salpingoophorectomy  2000  . Cataract extraction, bilateral  9/12, 1/13  . Bladder tack      Family History  Problem Relation Age of Onset  . Cancer Mother     Colon  . Cancer Father     Lung  . Diabetes      Paternal side  . Coronary artery disease Maternal Grandmother   . Coronary artery disease Paternal Grandmother   . Hypertension Mother     History   Social History  . Marital Status: Married    Spouse Name: N/A    Number of Children: 3  . Years of Education: N/A   Occupational History  . 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 Smoking status: Never Smoker   . Smokeless tobacco: Never Used  . Alcohol Use: No     Comment: Rare  . Drug Use: No  . Sexual Activity: Not on file   Other Topics Concern  . Not on file   Social History Narrative   No living will    Requests husband as health care POA   Would accept resuscitation attempts.   Would not want tube feeds if cognitively unaware   Review of Systems Sleeps well Good energy levels Appetite is great Weight is fairly stable in past year--may be down slightly Bowels have been okay----just occ constipation    Objective:   Physical Exam  Constitutional:  She appears well-developed and well-nourished. No distress.  HENT:  Mouth/Throat: Oropharynx is clear and moist. No oropharyngeal exudate.  Neck: Normal range of motion. Neck supple. No thyromegaly present.  Cardiovascular: Normal rate, regular rhythm, normal heart sounds and intact distal pulses.  Exam reveals no gallop.   No murmur heard. Pulmonary/Chest: Effort normal and breath sounds normal. No respiratory distress. She has no wheezes. She has no rales.  Abdominal: Soft. There is no tenderness.  Musculoskeletal: She exhibits no edema and no tenderness.  Lymphadenopathy:    She has no cervical adenopathy.  Neurological: She is alert.  Got orientation except October 2014 President "Obama, Bush,  [employer]?" 100-... Can't do math [id number] Recall 2/3  Psychiatric: She has a normal mood and affect. Her behavior is normal.          Assessment & Plan:

## 2013-06-09 NOTE — Assessment & Plan Note (Signed)
I have personally reviewed the Medicare Annual Wellness questionnaire and have noted 1. The patient's medical and social history 2. Their use of alcohol, tobacco or illicit drugs 3. Their current medications and supplements 4. The patient's functional ability including ADL's, fall risks, home safety risks and hearing or visual             impairment. 5. Diet and physical activities 6. Evidence for depression or mood disorders  The patients weight, height, BMI and visual acuity have been recorded in the chart I have made referrals, counseling and provided education to the patient based review of the above and I have provided the pt with a written personalized care plan for preventive services.  I have provided you with a copy of your personalized plan for preventive services. Please take the time to review along with your updated medication list.  Due for mammo next year and will be due for colonoscopy then also Discussed fitness

## 2013-06-10 ENCOUNTER — Encounter: Payer: Self-pay | Admitting: Cardiovascular Disease

## 2013-06-10 ENCOUNTER — Ambulatory Visit (INDEPENDENT_AMBULATORY_CARE_PROVIDER_SITE_OTHER): Payer: Medicare Other | Admitting: Cardiovascular Disease

## 2013-06-10 VITALS — BP 122/80 | HR 62 | Ht 65.0 in | Wt 147.0 lb

## 2013-06-10 DIAGNOSIS — R002 Palpitations: Secondary | ICD-10-CM

## 2013-06-10 DIAGNOSIS — I498 Other specified cardiac arrhythmias: Secondary | ICD-10-CM

## 2013-06-10 DIAGNOSIS — I471 Supraventricular tachycardia: Secondary | ICD-10-CM

## 2013-06-10 DIAGNOSIS — F411 Generalized anxiety disorder: Secondary | ICD-10-CM

## 2013-06-10 MED ORDER — METOPROLOL TARTRATE 25 MG PO TABS: 25.0000 mg | ORAL_TABLET | Freq: Two times a day (BID) | ORAL | Status: DC

## 2013-06-10 NOTE — Patient Instructions (Signed)
You are doing well. No medication changes were made. Continue the metoprolol twice a day  Please take extra metoprolol as needed for episodes of tachycardia  Please call us if you have new issues that need to be addressed before your next appt.  Your physician wants you to follow-up in: 6 months.  You will receive a reminder letter in the mail two months in advance. If you don't receive a letter, please call our office to schedule the follow-up appointment.

## 2013-06-10 NOTE — Progress Notes (Signed)
Patient ID: Denise Rhodes, female    DOB: Sep 25, 1940, 72 y.o.   MRN: [id number]  HPI Comments: Denise Rhodes is a 72 y.o. female w/ PMHx s/f anxiety, IBS and palpitations, recently seen for worsening palpitations. She had a 48 hour Holter monitor and presents for followup today Prior history of possible mitral valve prolapse  Her monitor showed runs of atrial tachycardia, frequent APCs, sometimes in a bigeminal pattern. She was started on metoprolol 25 mg twice a day  In followup today, she reports improvement in her symptoms over the past 5 days. She did forget the metoprolol in the morning one day this week and did have mild symptoms that resolved after restarting the metoprolol. She does have metoprolol which she takes when necessary. She's not had to take this recently.  Previous echo indicated EF 55-60%, grade 1 diastolic dysfunction, structurally normal MV with normal leaflet separation, trivial MR.   EKG: Normal sinus rhythm with rate 62 beats per minute, no significant ST or T wave changes   Outpatient Encounter Prescriptions as of 06/10/2013  Medication Sig  . ALPRAZolam (XANAX) 0.25 MG tablet Take 0.25 mg by mouth 3 (three) times daily as needed.  . cholecalciferol (VITAMIN D) 1000 UNITS tablet Take 1,000 Units by mouth daily.  . magnesium 30 MG tablet Take 250 mg by mouth daily.  . metoprolol tartrate (LOPRESSOR) 25 MG tablet Take 1 tablet (25 mg total) by mouth 2 (two) times daily.  . propranolol (INDERAL) 20 MG tablet Take 1 tablet (20 mg total) by mouth as needed (for heart palpitations).  . vitamin B-12 (CYANOCOBALAMIN) 1000 MCG tablet Take 1,000 mcg by mouth daily as needed.  . [DISCONTINUED] metoprolol tartrate (LOPRESSOR) 25 MG tablet Take 1 tablet (25 mg total) by mouth 2 (two) times daily.     Review of Systems  Constitutional: Negative.   HENT: Negative.   Eyes: Negative.   Respiratory: Negative.   Cardiovascular: Negative.   Gastrointestinal: Negative.    Endocrine: Negative.   Musculoskeletal: Negative.   Skin: Negative.   Allergic/Immunologic: Negative.   Neurological: Negative.   Hematological: Negative.   Psychiatric/Behavioral: Negative.   All other systems reviewed and are negative.    BP 122/80  Pulse 62  Ht 5\' 5"  (1.651 m)  Wt 147 lb (66.679 kg)  BMI 24.46 kg/m2  Physical Exam  Nursing note and vitals reviewed. Constitutional: She is oriented to person, place, and time. She appears well-developed and well-nourished.  HENT:  Head: Normocephalic.  Nose: Nose normal.  Mouth/Throat: Oropharynx is clear and moist.  Eyes: Conjunctivae are normal. Pupils are equal, round, and reactive to light.  Neck: Normal range of motion. Neck supple. No JVD present.  Cardiovascular: Normal rate, regular rhythm, S1 normal, S2 normal, normal heart sounds and intact distal pulses.  Exam reveals no gallop and no friction rub.   No murmur heard. Pulmonary/Chest: Effort normal and breath sounds normal. No respiratory distress. She has no wheezes. She has no rales. She exhibits no tenderness.  Abdominal: Soft. Bowel sounds are normal. She exhibits no distension. There is no tenderness.  Musculoskeletal: Normal range of motion. She exhibits no edema and no tenderness.  Lymphadenopathy:    She has no cervical adenopathy.  Neurological: She is alert and oriented to person, place, and time. Coordination normal.  Skin: Skin is warm and dry. No rash noted. No erythema.  Psychiatric: She has a normal mood and affect. Her behavior is normal. Judgment and thought content normal.  Assessment and Plan

## 2013-06-10 NOTE — Assessment & Plan Note (Signed)
Certainly possible that previous anxiety could have been exacerbated by underlying arrhythmia

## 2013-06-10 NOTE — Assessment & Plan Note (Signed)
Significant arrhythmia seen on recent Holter monitor. She was started on metoprolol tartrate 25 mg twice a day. In followup today, has had significant improvement in her symptoms. We have suggested she continue on metoprolol twice a day and that she take propranolol as needed for breakthrough arrhythmia.

## 2013-09-07 ENCOUNTER — Telehealth: Payer: Self-pay | Admitting: *Deleted

## 2013-09-07 NOTE — Telephone Encounter (Signed)
Patient needs a letter to dentist Dr. Sharlett Iles that she doesn't have a heart condition to premed on antibiotic. Fax # [phone]

## 2013-09-08 NOTE — Telephone Encounter (Signed)
Antibiotic is not needed for dental procedure, only for mechanical valve Even if someone has mitral valve prolapse, antibiotic not needed

## 2013-09-09 NOTE — Telephone Encounter (Signed)
Letter faxed to Dr. Buel Ream office w/ Dr. Donivan Scull recommendation.

## 2013-10-20 ENCOUNTER — Other Ambulatory Visit: Payer: Self-pay | Admitting: Family Medicine

## 2013-10-20 NOTE — Telephone Encounter (Signed)
Last office visit 06/09/2013.  Not on current medication list.  Ok to refill?

## 2013-10-21 NOTE — Telephone Encounter (Signed)
Okay to refill ?tubes--- if so, can give #1 tube with 3 refills (not sure what the #5 request is for)

## 2013-10-21 NOTE — Telephone Encounter (Signed)
rx sent to pharmacy by e-script  

## 2013-10-27 ENCOUNTER — Other Ambulatory Visit: Payer: Self-pay | Admitting: *Deleted

## 2013-10-27 MED ORDER — ACYCLOVIR 5 % EX OINT: 6.0000 "application " | TOPICAL_OINTMENT | Freq: Every day | CUTANEOUS | Status: DC

## 2013-11-02 NOTE — Telephone Encounter (Signed)
Mr Viti left v/m requesting refill acyclovir;spoke with Joelene Millin at [address] and rx ready for pick up. Mr [facility] notified by v/m that rx ready for pick up.

## 2013-12-21 ENCOUNTER — Ambulatory Visit (INDEPENDENT_AMBULATORY_CARE_PROVIDER_SITE_OTHER): Payer: Medicare Other | Admitting: Cardiovascular Disease

## 2013-12-21 ENCOUNTER — Encounter: Payer: Self-pay | Admitting: Cardiovascular Disease

## 2013-12-21 VITALS — BP 138/82 | HR 62 | Ht 65.0 in | Wt 150.0 lb

## 2013-12-21 DIAGNOSIS — I471 Supraventricular tachycardia: Secondary | ICD-10-CM

## 2013-12-21 DIAGNOSIS — I498 Other specified cardiac arrhythmias: Secondary | ICD-10-CM

## 2013-12-21 DIAGNOSIS — F411 Generalized anxiety disorder: Secondary | ICD-10-CM

## 2013-12-21 DIAGNOSIS — R002 Palpitations: Secondary | ICD-10-CM

## 2013-12-21 NOTE — Assessment & Plan Note (Signed)
Symptoms have resolved on her metoprolol. Suggested she stay on the beta blocker. She could take extra propranolol as needed for breakthrough episodes

## 2013-12-21 NOTE — Patient Instructions (Signed)
You are doing well. No medication changes were made.  Please call us if you have new issues that need to be addressed before your next appt.  Your physician wants you to follow-up in: 12 months.  You will receive a reminder letter in the mail two months in advance. If you don't receive a letter, please call our office to schedule the follow-up appointment. 

## 2013-12-21 NOTE — Progress Notes (Signed)
   Patient ID: Denise Rhodes, female    DOB: 1941-04-01, 73 y.o.   MRN: [id number]  HPI Comments: Denise Rhodes is a 73 y.o. female w/ PMHx s/f anxiety, IBS and palpitations, recently seen for worsening palpitations. She had a 48 hour Holter monitor and presents for followup today Prior history of possible mitral valve prolapse  Prior monitor showed runs of atrial tachycardia, frequent APCs, sometimes in a bigeminal pattern. She was started on metoprolol 25 mg twice a day Anything she has felt better with no further problems with palpitations. In today's visit we discussed her lifestyle. She is relatively sedentary. Legs are getting weaker No significant breakthrough palpitations  Previous echo indicated EF 62-95%, grade 1 diastolic dysfunction, structurally normal MV with normal leaflet separation, trivial MR.   EKG: Normal sinus rhythm with rate 62 beats per minute, no significant ST or T wave changes   Outpatient Encounter Prescriptions as of 12/21/2013  Medication Sig  . acyclovir ointment (ZOVIRAX) 5 % Apply 6 application topically daily. For 7 days  . ALPRAZolam (XANAX) 0.25 MG tablet Take 0.25 mg by mouth 3 (three) times daily as needed.  . cholecalciferol (VITAMIN D) 1000 UNITS tablet Take 1,000 Units by mouth daily.  . magnesium 30 MG tablet Take 250 mg by mouth daily.  . metoprolol tartrate (LOPRESSOR) 25 MG tablet Take 1 tablet (25 mg total) by mouth 2 (two) times daily.  . propranolol (INDERAL) 20 MG tablet Take 1 tablet (20 mg total) by mouth as needed (for heart palpitations).  . vitamin B-12 (CYANOCOBALAMIN) 1000 MCG tablet Take 1,000 mcg by mouth daily as needed.   Review of Systems  Constitutional: Negative.   HENT: Negative.   Eyes: Negative.   Respiratory: Negative.   Cardiovascular: Negative.   Gastrointestinal: Negative.   Endocrine: Negative.   Musculoskeletal: Negative.   Skin: Negative.   Allergic/Immunologic: Negative.   Neurological: Negative.    Hematological: Negative.   Psychiatric/Behavioral: Negative.   All other systems reviewed and are negative.   BP 138/82  Pulse 62  Ht 5\' 5"  (1.651 m)  Wt 150 lb (68.04 kg)  BMI 24.96 kg/m2  Physical Exam  Nursing note and vitals reviewed. Constitutional: She is oriented to person, place, and time. She appears well-developed and well-nourished.  HENT:  Head: Normocephalic.  Nose: Nose normal.  Mouth/Throat: Oropharynx is clear and moist.  Eyes: Conjunctivae are normal. Pupils are equal, round, and reactive to light.  Neck: Normal range of motion. Neck supple. No JVD present.  Cardiovascular: Normal rate, regular rhythm, S1 normal, S2 normal, normal heart sounds and intact distal pulses.  Exam reveals no gallop and no friction rub.   No murmur heard. Pulmonary/Chest: Effort normal and breath sounds normal. No respiratory distress. She has no wheezes. She has no rales. She exhibits no tenderness.  Abdominal: Soft. Bowel sounds are normal. She exhibits no distension. There is no tenderness.  Musculoskeletal: Normal range of motion. She exhibits no edema and no tenderness.  Lymphadenopathy:    She has no cervical adenopathy.  Neurological: She is alert and oriented to person, place, and time. Coordination normal.  Skin: Skin is warm and dry. No rash noted. No erythema.  Psychiatric: She has a normal mood and affect. Her behavior is normal. Judgment and thought content normal.    Assessment and Plan

## 2013-12-21 NOTE — Assessment & Plan Note (Signed)
Recommended she start a regular walking program.

## 2014-02-03 DIAGNOSIS — K209 Esophagitis, unspecified without bleeding: Secondary | ICD-10-CM

## 2014-02-03 HISTORY — DX: Esophagitis, unspecified without bleeding: K20.90

## 2014-02-23 ENCOUNTER — Ambulatory Visit: Payer: Self-pay | Admitting: Gastroenterology

## 2014-02-26 LAB — PATHOLOGY REPORT

## 2014-03-01 ENCOUNTER — Encounter: Payer: Self-pay | Admitting: Internal Medicine

## 2014-04-22 ENCOUNTER — Ambulatory Visit (INDEPENDENT_AMBULATORY_CARE_PROVIDER_SITE_OTHER): Payer: Medicare Other | Admitting: *Deleted

## 2014-04-22 DIAGNOSIS — Z23 Encounter for immunization: Secondary | ICD-10-CM

## 2014-08-30 ENCOUNTER — Other Ambulatory Visit: Payer: Self-pay | Admitting: Cardiovascular Disease

## 2014-09-06 HISTORY — PX: OTHER SURGICAL HISTORY: SHX169

## 2014-10-04 ENCOUNTER — Encounter: Payer: Self-pay | Admitting: Internal Medicine

## 2014-10-05 LAB — HM MAMMOGRAPHY: HM Mammogram: NORMAL

## 2014-11-05 ENCOUNTER — Ambulatory Visit (INDEPENDENT_AMBULATORY_CARE_PROVIDER_SITE_OTHER): Payer: Medicare Other | Admitting: Cardiovascular Disease

## 2014-11-05 ENCOUNTER — Encounter: Payer: Self-pay | Admitting: Cardiovascular Disease

## 2014-11-05 VITALS — BP 142/76 | HR 59 | Ht 65.0 in | Wt 148.2 lb

## 2014-11-05 DIAGNOSIS — F419 Anxiety disorder, unspecified: Secondary | ICD-10-CM

## 2014-11-05 DIAGNOSIS — I059 Rheumatic mitral valve disease, unspecified: Secondary | ICD-10-CM | POA: Diagnosis not present

## 2014-11-05 DIAGNOSIS — I471 Supraventricular tachycardia: Secondary | ICD-10-CM | POA: Diagnosis not present

## 2014-11-05 NOTE — Assessment & Plan Note (Signed)
We've recommended she continue her metoprolol twice a day. No further symptoms

## 2014-11-05 NOTE — Assessment & Plan Note (Signed)
Recommended she start a regular exercise program

## 2014-11-05 NOTE — Patient Instructions (Signed)
You are doing well. No medication changes were made.  Please call us if you have new issues that need to be addressed before your next appt.  Your physician wants you to follow-up in: 12 months.  You will receive a reminder letter in the mail two months in advance. If you don't receive a letter, please call our office to schedule the follow-up appointment. 

## 2014-11-05 NOTE — Progress Notes (Signed)
Patient ID: Denise Rhodes, female    DOB: [birth year], 74 y.o.   MRN: [id number]  HPI Comments: Denise Rhodes is a 74 y.o. female w/ PMHx s/f anxiety, IBS and palpitations, seen in the past for worsening palpitations. She had a 48 hour Holter monitor on prior visit Prior history of possible mitral valve prolapse She presents today for follow-up of her palpitations  In general she feels fine. She takes metoprolol tartrate 25 mg twice a day. Denies any further palpitations on this regimen. She is not needing propranolol for breakthrough palpitations In the past, she was sedentary, legs are getting weaker. She is thinking of starting a exercise regimen No recent lab work to review. Has not seen primary care this year  EKG on today's visit shows normal sinus rhythm with rate 59 bpm, no significant ST or T-wave changes  Other past medical history Prior monitor showed runs of atrial tachycardia, frequent APCs, sometimes in a bigeminal pattern.  Previous echo indicated EF 25-05%, grade 1 diastolic dysfunction, structurally normal MV with normal leaflet separation, trivial MR.    No Known Allergies  Outpatient Encounter Prescriptions as of 11/05/2014  Medication Sig  . acyclovir ointment (ZOVIRAX) 5 % Apply 6 application topically daily. For 7 days  . ALPRAZolam (XANAX) 0.25 MG tablet Take 0.25 mg by mouth 3 (three) times daily as needed.  . cholecalciferol (VITAMIN D) 1000 UNITS tablet Take 1,000 Units by mouth daily.  . magnesium 30 MG tablet Take 250 mg by mouth daily.  . metoprolol tartrate (LOPRESSOR) 25 MG tablet TAKE ONE TABLET BY MOUTH TWICE DAILY  . propranolol (INDERAL) 20 MG tablet Take 1 tablet (20 mg total) by mouth as needed (for heart palpitations).  . vitamin B-12 (CYANOCOBALAMIN) 1000 MCG tablet Take 1,000 mcg by mouth daily as needed.    Past Medical History  Diagnosis Date  . Anxiety state, unspecified   . Mitral valve disorders   . Osteoporosis, unspecified   . Acute  upper respiratory infections of unspecified site   . Urinary tract infection, site not specified   . Gilbert's syndrome   . H/O: pneumonia 09/2006  . IBS (irritable bowel syndrome)   . Esophagitis 7/15    and gastritis on EGD-- Dr Gustavo Lah    Past Surgical History  Procedure Laterality Date  . Total abdominal hysterectomy w/ bilateral salpingoophorectomy  2000  . Cataract extraction, bilateral  9/12, 1/13  . Bladder tack    . Vaginectomy colpocleisis cysto  2/16    Social History  reports that she has never smoked. She has never used smokeless tobacco. She reports that she does not drink alcohol or use illicit drugs.  Family History family history includes Cancer in her father and mother; Coronary artery disease in her maternal grandmother and paternal grandmother; Diabetes in an other family member; Hypertension in her mother.   Review of Systems  Constitutional: Negative.   Respiratory: Negative.   Cardiovascular: Negative.   Gastrointestinal: Negative.   Musculoskeletal: Negative.   Skin: Negative.   Neurological: Negative.   Hematological: Negative.   Psychiatric/Behavioral: Negative.   All other systems reviewed and are negative.   BP 142/76 mmHg  Pulse 59  Ht 5\' 5"  (1.651 m)  Wt 148 lb 4 oz (67.246 kg)  BMI 24.67 kg/m2  Physical Exam  Constitutional: She is oriented to person, place, and time. She appears well-developed and well-nourished.  HENT:  Head: Normocephalic.  Nose: Nose normal.  Mouth/Throat: Oropharynx is clear and moist.  Eyes: Conjunctivae are normal. Pupils are equal, round, and reactive to light.  Neck: Normal range of motion. Neck supple. No JVD present.  Cardiovascular: Normal rate, regular rhythm, S1 normal, S2 normal, normal heart sounds and intact distal pulses.  Exam reveals no gallop and no friction rub.   No murmur heard. Pulmonary/Chest: Effort normal and breath sounds normal. No respiratory distress. She has no wheezes. She has no  rales. She exhibits no tenderness.  Abdominal: Soft. Bowel sounds are normal. She exhibits no distension. There is no tenderness.  Musculoskeletal: Normal range of motion. She exhibits no edema or tenderness.  Lymphadenopathy:    She has no cervical adenopathy.  Neurological: She is alert and oriented to person, place, and time. Coordination normal.  Skin: Skin is warm and dry. No rash noted. No erythema.  Psychiatric: She has a normal mood and affect. Her behavior is normal. Judgment and thought content normal.    Assessment and Plan  Nursing note and vitals reviewed.

## 2014-11-10 ENCOUNTER — Encounter: Payer: Self-pay | Admitting: Internal Medicine

## 2014-11-16 ENCOUNTER — Ambulatory Visit (INDEPENDENT_AMBULATORY_CARE_PROVIDER_SITE_OTHER): Payer: Medicare Other | Admitting: Internal Medicine

## 2014-11-16 ENCOUNTER — Encounter: Payer: Self-pay | Admitting: Internal Medicine

## 2014-11-16 VITALS — BP 138/86 | HR 70 | Temp 98.1°F | Ht 64.75 in | Wt 148.2 lb

## 2014-11-16 DIAGNOSIS — Z0000 Encounter for general adult medical examination without abnormal findings: Secondary | ICD-10-CM

## 2014-11-16 DIAGNOSIS — I471 Supraventricular tachycardia: Secondary | ICD-10-CM | POA: Diagnosis not present

## 2014-11-16 DIAGNOSIS — Z23 Encounter for immunization: Secondary | ICD-10-CM

## 2014-11-16 DIAGNOSIS — Z7189 Other specified counseling: Secondary | ICD-10-CM

## 2014-11-16 DIAGNOSIS — F39 Unspecified mood [affective] disorder: Secondary | ICD-10-CM | POA: Diagnosis not present

## 2014-11-16 DIAGNOSIS — M810 Age-related osteoporosis without current pathological fracture: Secondary | ICD-10-CM | POA: Diagnosis not present

## 2014-11-16 DIAGNOSIS — R208 Other disturbances of skin sensation: Secondary | ICD-10-CM

## 2014-11-16 DIAGNOSIS — R200 Anesthesia of skin: Secondary | ICD-10-CM | POA: Insufficient documentation

## 2014-11-16 LAB — CBC WITH DIFFERENTIAL/PLATELET
BASOS PCT: 0.7 % (ref 0.0–3.0)
Basophils Absolute: 0.1 10*3/uL (ref 0.0–0.1)
EOS PCT: 2.6 % (ref 0.0–5.0)
Eosinophils Absolute: 0.2 10*3/uL (ref 0.0–0.7)
HCT: 41 % (ref 36.0–46.0)
Hemoglobin: 13.7 g/dL (ref 12.0–15.0)
LYMPHS PCT: 33.1 % (ref 12.0–46.0)
Lymphs Abs: 2.6 10*3/uL (ref 0.7–4.0)
MCHC: 33.3 g/dL (ref 30.0–36.0)
MCV: 86.8 fl (ref 78.0–100.0)
Monocytes Absolute: 0.6 10*3/uL (ref 0.1–1.0)
Monocytes Relative: 7.3 % (ref 3.0–12.0)
NEUTROS ABS: 4.4 10*3/uL (ref 1.4–7.7)
Neutrophils Relative %: 56.3 % (ref 43.0–77.0)
Platelets: 231 10*3/uL (ref 150.0–400.0)
RBC: 4.73 Mil/uL (ref 3.87–5.11)
RDW: 13.9 % (ref 11.5–15.5)
WBC: 7.9 10*3/uL (ref 4.0–10.5)

## 2014-11-16 LAB — LIPID PANEL
Cholesterol: 164 mg/dL (ref 0–200)
HDL: 61.4 mg/dL (ref >39.00)
LDL Cholesterol: 88 mg/dL (ref 0–99)
NonHDL: 102.6
TRIGLYCERIDES: 71 mg/dL (ref 0.0–149.0)
Total CHOL/HDL Ratio: 3
VLDL: 14.2 mg/dL (ref 0.0–40.0)

## 2014-11-16 LAB — COMPREHENSIVE METABOLIC PANEL
ALT: 14 U/L (ref 0–35)
AST: 18 U/L (ref 0–37)
Albumin: 4 g/dL (ref 3.5–5.2)
Alkaline Phosphatase: 87 U/L (ref 39–117)
BUN: 13 mg/dL (ref 6–23)
CALCIUM: 9.7 mg/dL (ref 8.4–10.5)
CHLORIDE: 106 meq/L (ref 96–112)
CO2: 30 meq/L (ref 19–32)
Creatinine, Ser: 0.71 mg/dL (ref 0.40–1.20)
GFR: 85.58 mL/min (ref >60.00)
GLUCOSE: 95 mg/dL (ref 70–99)
POTASSIUM: 4.3 meq/L (ref 3.5–5.1)
Sodium: 140 mEq/L (ref 135–145)
Total Bilirubin: 1 mg/dL (ref 0.2–1.2)
Total Protein: 6.9 g/dL (ref 6.0–8.3)

## 2014-11-16 LAB — T4, FREE: Free T4: 0.96 ng/dL (ref 0.60–1.60)

## 2014-11-16 LAB — VITAMIN B12: VITAMIN B 12: 247 pg/mL (ref 211–911)

## 2014-11-16 MED ORDER — ZOSTER VACCINE LIVE 19400 UNT/0.65ML ~~LOC~~ SOLR: 0.6500 mL | Freq: Once | SUBCUTANEOUS | Status: DC

## 2014-11-16 NOTE — Assessment & Plan Note (Signed)
Managed with beta blocker Will check labs

## 2014-11-16 NOTE — Assessment & Plan Note (Signed)
?  related to nerves Will just check B12 level

## 2014-11-16 NOTE — Assessment & Plan Note (Signed)
I have personally reviewed the Medicare Annual Wellness questionnaire and have noted 1. The patient's medical and social history 2. Their use of alcohol, tobacco or illicit drugs 3. Their current medications and supplements 4. The patient's functional ability including ADL's, fall risks, home safety risks and hearing or visual             impairment. 5. Diet and physical activities 6. Evidence for depression or mood disorders  The patients weight, height, BMI and visual acuity have been recorded in the chart I have made referrals, counseling and provided education to the patient based review of the above and I have provided the pt with a written personalized care plan for preventive services.  I have provided you with a copy of your personalized plan for preventive services. Please take the time to review along with your updated medication list.  Recent mammogram (advised another in 2 years) and colonoscopy (done with these) Rx for zostavax prevnar today Discussed fitness

## 2014-11-16 NOTE — Assessment & Plan Note (Signed)
Mostly anxiety at times---alprazolam helps but she uses it rarely Mild down times--mostly when worrying. No Rx needed for this

## 2014-11-16 NOTE — Progress Notes (Signed)
Subjective:    Patient ID: Denise Rhodes, female    DOB: Dec 19, 1940, 74 y.o.   MRN: [id number]  HPI Here with husband for Medicare wellness and follow up of chronic medical issues Reviewed incomplete form and advanced directives Sees Dr Rockey Situ, dentist Dr Sharlett Iles, eye--Dr Rosana Hoes at [facility] No alcohol or tobacco products Hasn't been exercising Did have the vaginal surgery 2 months ago Independent with instrumental ADLs No serious memory problems Hearing fair--slight decline Vision is fine No falls Ongoing anxiety at times---no major depression or anhedonia  Anxiety persists Worries about her children, etc etc Hasn't used the alprazolam often  Has kept up with Dr Rockey Situ No recent palpitations No chest pain No SOB No dizziness or syncope  Current Outpatient Prescriptions on File Prior to Visit  Medication Sig Dispense Refill  . acyclovir ointment (ZOVIRAX) 5 % Apply 6 application topically daily. For 7 days (Patient taking differently: Apply 6 application topically daily as needed. For 7 days) 5 g 3  . ALPRAZolam (XANAX) 0.25 MG tablet Take 0.25 mg by mouth 3 (three) times daily as needed.    . magnesium 30 MG tablet Take 250 mg by mouth daily as needed.     . metoprolol tartrate (LOPRESSOR) 25 MG tablet TAKE ONE TABLET BY MOUTH TWICE DAILY 180 tablet 3  . propranolol (INDERAL) 20 MG tablet Take 1 tablet (20 mg total) by mouth as needed (for heart palpitations). 60 tablet 3  . vitamin B-12 (CYANOCOBALAMIN) 1000 MCG tablet Take 1,000 mcg by mouth daily as needed.    . cholecalciferol (VITAMIN D) 1000 UNITS tablet Take 1,000 Units by mouth daily.     No current facility-administered medications on file prior to visit.    No Known Allergies  Past Medical History  Diagnosis Date  . Anxiety state, unspecified   . Mitral valve disorders   . Osteoporosis, unspecified   . Acute upper respiratory infections of unspecified site   . Urinary tract infection, site not specified   .  Gilbert's syndrome   . H/O: pneumonia 09/2006  . IBS (irritable bowel syndrome)   . Esophagitis 7/15    and gastritis on EGD-- Dr Gustavo Lah    Past Surgical History  Procedure Laterality Date  . Total abdominal hysterectomy w/ bilateral salpingoophorectomy  2000  . Cataract extraction, bilateral  9/12, 1/13  . Bladder tack    . Vaginectomy colpocleisis cysto  2/16    Family History  Problem Relation Age of Onset  . Cancer Mother     Colon  . Cancer Father     Lung  . Diabetes      Paternal side  . Coronary artery disease Maternal Grandmother   . Coronary artery disease Paternal Grandmother   . Hypertension Mother     History   Social History  . Marital Status: Married    Spouse Name: N/A  . Number of Children: 3  . Years of Education: N/A   Occupational History  . 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 Smoking status: Never Smoker   . Smokeless tobacco: Never Used  . Alcohol Use: No     Comment: Rare  . Drug Use: No  . Sexual Activity: Not on file   Other Topics Concern  . Not on file   Social History Narrative   Has living will    Husband is health care POA   Would accept resuscitation attempts.   Would not want tube  feeds if cognitively unaware   Review of Systems Sleeps okay in general Does have some numbness in fingertips and toes. Intermittent only Small ganglion on right palm--no problems with fingers Bowels are good Able to empty bladder easier and without pain since the surgery. Does have some incontinence Wears seat belt Teeth okay--some gum disease though. Sees dentist every 3 months    Objective:   Physical Exam  Constitutional: She is oriented to person, place, and time. She appears well-developed and well-nourished. No distress.  HENT:  Mouth/Throat: Oropharynx is clear and moist. No oropharyngeal exudate.  Neck: Normal range of motion. Neck supple. No thyromegaly present.  Cardiovascular: Normal rate,  regular rhythm, normal heart sounds and intact distal pulses.  Exam reveals no gallop.   No murmur heard. Pulmonary/Chest: Effort normal and breath sounds normal. No respiratory distress. She has no wheezes. She has no rales.  Abdominal: Soft. There is no tenderness.  Musculoskeletal: She exhibits no edema or tenderness.  Lymphadenopathy:    She has no cervical adenopathy.  Neurological: She is alert and oriented to person, place, and time.  President-- "Obama, Bush, ?" 100-93-   "I can't do that" D-l-r-o-w Recall 3/3  Skin: No rash noted.  Psychiatric: She has a normal mood and affect. Her behavior is normal.          Assessment & Plan:

## 2014-11-16 NOTE — Assessment & Plan Note (Signed)
See social history 

## 2014-11-16 NOTE — Addendum Note (Signed)
Addended by: Emelia Salisbury C on: 11/16/2014 09:07 AM   Modules accepted: Orders

## 2014-11-16 NOTE — Progress Notes (Signed)
Pre visit review using our clinic review tool, if applicable. No additional management support is needed unless otherwise documented below in the visit note. 

## 2014-11-16 NOTE — Assessment & Plan Note (Signed)
Reviewed Rx for 5 years with bisphosphonates She doesn't want this Urged her to restart vitamin D and do regular weight bearing exercise like walking

## 2014-11-28 NOTE — Op Note (Signed)
PATIENT NAME:  Denise Rhodes, Denise Rhodes MR#:  [id number] DATE OF BIRTH:  [birth year]  DATE OF PROCEDURE:  08/21/2011  PREOPERATIVE DIAGNOSIS: Visually significant cataract of the right eye.   POSTOPERATIVE DIAGNOSIS: Visually significant cataract of the right eye.   OPERATIVE PROCEDURE: Cataract extraction by phacoemulsification with implant of intraocular lens to right eye.   SURGEON: Birder Robson, MD.   ANESTHESIA:  1. Managed anesthesia care.  2. Topical tetracaine drops followed by 2% Xylocaine jelly applied in the preoperative holding area.   COMPLICATIONS: None.   TECHNIQUE:  Stop-and-chop    DESCRIPTION OF PROCEDURE: The patient was examined and consented in the preoperative holding area where the aforementioned topical anesthesia was applied to the right eye and then brought back to the Operating Room where the right eye was prepped and draped in the usual sterile ophthalmic fashion and a lid speculum was placed. A paracentesis was created with the side port blade and the anterior chamber was filled with viscoelastic. A near clear corneal incision was performed with the steel keratome. A continuous curvilinear capsulorrhexis was performed with a cystotome followed by the capsulorrhexis forceps. Hydrodissection and hydrodelineation were carried out with BSS on a blunt cannula. The lens was removed in a stop-and-chop technique and the remaining cortical material was removed with the irrigation-aspiration handpiece. The capsular bag was inflated with viscoelastic and the Alcon SN60WF 22.0-diopter lens, serial number [id number] was placed in the capsular bag without complication. The remaining viscoelastic was removed from the eye with the irrigation-aspiration handpiece. The wounds were hydrated. The anterior chamber was flushed with Miostat and the eye was inflated to physiologic pressure. The wounds were found to be water tight. The eye was dressed with Vigamox and Omnipred. The patient was  given protective glasses to wear throughout the day and a shield with which to sleep tonight. The patient was also given drops with which to begin a drop regimen today and will follow-up with me in one day.   ____________________________ Livingston Diones. Daphna Lafuente, MD wlp:drc D: 08/21/2011 12:11:30 ET T: 08/21/2011 12:20:37 ET JOB#: [id number]  cc: Donasia Wimes L. Marybell Robards, MD, <Dictator> Livingston Diones Emary Zalar MD ELECTRONICALLY SIGNED 09/18/2011 12:46

## 2015-06-23 ENCOUNTER — Ambulatory Visit (INDEPENDENT_AMBULATORY_CARE_PROVIDER_SITE_OTHER): Payer: Medicare Other

## 2015-06-23 DIAGNOSIS — Z23 Encounter for immunization: Secondary | ICD-10-CM | POA: Diagnosis not present

## 2015-06-24 ENCOUNTER — Ambulatory Visit: Payer: Self-pay

## 2015-09-16 LAB — HM MAMMOGRAPHY: HM Mammogram: NORMAL

## 2015-09-21 ENCOUNTER — Other Ambulatory Visit: Payer: Self-pay | Admitting: Cardiovascular Disease

## 2015-09-21 NOTE — Telephone Encounter (Signed)
Error

## 2015-09-23 ENCOUNTER — Other Ambulatory Visit: Payer: Self-pay | Admitting: Cardiovascular Disease

## 2015-09-23 NOTE — Telephone Encounter (Signed)
Pt needs this refilled. Pt is at pharmacy waiting

## 2015-10-06 ENCOUNTER — Encounter: Payer: Self-pay | Admitting: Internal Medicine

## 2015-11-22 ENCOUNTER — Encounter: Payer: Self-pay | Admitting: Internal Medicine

## 2015-11-22 ENCOUNTER — Ambulatory Visit (INDEPENDENT_AMBULATORY_CARE_PROVIDER_SITE_OTHER): Payer: Medicare Other | Admitting: Internal Medicine

## 2015-11-22 VITALS — BP 132/76 | HR 56 | Temp 97.9°F | Ht 65.0 in | Wt 149.0 lb

## 2015-11-22 DIAGNOSIS — Z0000 Encounter for general adult medical examination without abnormal findings: Secondary | ICD-10-CM | POA: Diagnosis not present

## 2015-11-22 DIAGNOSIS — I471 Supraventricular tachycardia: Secondary | ICD-10-CM

## 2015-11-22 DIAGNOSIS — F39 Unspecified mood [affective] disorder: Secondary | ICD-10-CM | POA: Diagnosis not present

## 2015-11-22 DIAGNOSIS — Z7189 Other specified counseling: Secondary | ICD-10-CM

## 2015-11-22 DIAGNOSIS — M810 Age-related osteoporosis without current pathological fracture: Secondary | ICD-10-CM

## 2015-11-22 LAB — CBC WITH DIFFERENTIAL/PLATELET
BASOS PCT: 0.5 % (ref 0.0–3.0)
Basophils Absolute: 0 10*3/uL (ref 0.0–0.1)
EOS ABS: 0.2 10*3/uL (ref 0.0–0.7)
EOS PCT: 2.3 % (ref 0.0–5.0)
HEMATOCRIT: 41.5 % (ref 36.0–46.0)
HEMOGLOBIN: 13.6 g/dL (ref 12.0–15.0)
LYMPHS PCT: 29.1 % (ref 12.0–46.0)
Lymphs Abs: 2.4 10*3/uL (ref 0.7–4.0)
MCHC: 32.8 g/dL (ref 30.0–36.0)
MCV: 88.1 fl (ref 78.0–100.0)
MONO ABS: 0.6 10*3/uL (ref 0.1–1.0)
Monocytes Relative: 6.9 % (ref 3.0–12.0)
NEUTROS ABS: 5 10*3/uL (ref 1.4–7.7)
Neutrophils Relative %: 61.2 % (ref 43.0–77.0)
PLATELETS: 221 10*3/uL (ref 150.0–400.0)
RBC: 4.72 Mil/uL (ref 3.87–5.11)
RDW: 14.1 % (ref 11.5–15.5)
WBC: 8.1 10*3/uL (ref 4.0–10.5)

## 2015-11-22 LAB — COMPREHENSIVE METABOLIC PANEL
ALBUMIN: 4.1 g/dL (ref 3.5–5.2)
ALT: 18 U/L (ref 0–35)
AST: 21 U/L (ref 0–37)
Alkaline Phosphatase: 78 U/L (ref 39–117)
BUN: 14 mg/dL (ref 6–23)
CHLORIDE: 106 meq/L (ref 96–112)
CO2: 32 meq/L (ref 19–32)
CREATININE: 0.69 mg/dL (ref 0.40–1.20)
Calcium: 9.8 mg/dL (ref 8.4–10.5)
GFR: 88.21 mL/min (ref >60.00)
Glucose, Bld: 93 mg/dL (ref 70–99)
POTASSIUM: 4.6 meq/L (ref 3.5–5.1)
SODIUM: 141 meq/L (ref 135–145)
Total Bilirubin: 1.2 mg/dL (ref 0.2–1.2)
Total Protein: 7 g/dL (ref 6.0–8.3)

## 2015-11-22 LAB — T4, FREE: Free T4: 0.81 ng/dL (ref 0.60–1.60)

## 2015-11-22 MED ORDER — ALPRAZOLAM 0.25 MG PO TABS: 0.2500 mg | ORAL_TABLET | Freq: Three times a day (TID) | ORAL | Status: DC | PRN

## 2015-11-22 MED ORDER — ACYCLOVIR 5 % EX OINT: 6.0000 "application " | TOPICAL_OINTMENT | Freq: Every day | CUTANEOUS | Status: DC

## 2015-11-22 MED ORDER — ZOSTER VACCINE LIVE 19400 UNT/0.65ML ~~LOC~~ SOLR: 0.6500 mL | Freq: Once | SUBCUTANEOUS | Status: DC

## 2015-11-22 NOTE — Progress Notes (Signed)
Pre visit review using our clinic review tool, if applicable. No additional management support is needed unless otherwise documented below in the visit note. 

## 2015-11-22 NOTE — Assessment & Plan Note (Signed)
Has occasional recurrences Asked her to take the propranolol when that happens

## 2015-11-22 NOTE — Progress Notes (Signed)
Subjective:    Patient ID: Denise Rhodes, female    DOB: 28-May-1941, 75 y.o.   MRN: [id number]  HPI Here for Medicare wellness and follow up of chronic health conditions Husband is here Reviewed form and advanced directives Reviewed other doctors Vision and hearing okay No tobacco or alcohol Not exercising No falls Independent with instrumental ADLs No major cognitive problems---just some brief short term issues  No new concerns Didn't get shingles vaccine  Has had some palpitations and sometimes mild nausea. Gets sensation like panic attack Will get spells twice a month or so--usually in bed at night Will try the alprazolam---but not the propranolol No chest pain No dizziness or syncope No edema  Has had panic attacks in past Gets rare spells of anxiety separate from the heart issue No ongoing depression or anhedonia--will have brief sad feelings at times  Does take her vitamin D Not really exercising--but has made commitment to exercising daily again. Has Silver Sneakers but hasn't used  Current Outpatient Prescriptions on File Prior to Visit  Medication Sig Dispense Refill  . acyclovir ointment (ZOVIRAX) 5 % Apply 6 application topically daily. For 7 days (Patient taking differently: Apply 6 application topically daily as needed. For 7 days) 5 g 3  . ALPRAZolam (XANAX) 0.25 MG tablet Take 0.25 mg by mouth 3 (three) times daily as needed.    . cholecalciferol (VITAMIN D) 1000 UNITS tablet Take 1,000 Units by mouth daily.    . magnesium 30 MG tablet Take 250 mg by mouth daily as needed.     . metoprolol tartrate (LOPRESSOR) 25 MG tablet TAKE ONE TABLET BY MOUTH TWICE DAILY 180 tablet 3  . propranolol (INDERAL) 20 MG tablet Take 1 tablet (20 mg total) by mouth as needed (for heart palpitations). 60 tablet 3   No current facility-administered medications on file prior to visit.    No Known Allergies  Past Medical History  Diagnosis Date  . Anxiety state,  unspecified   . Mitral valve disorders   . Osteoporosis, unspecified   . Acute upper respiratory infections of unspecified site   . Urinary tract infection, site not specified   . Gilbert's syndrome   . H/O: pneumonia 09/2006  . IBS (irritable bowel syndrome)   . Esophagitis 7/15    and gastritis on EGD-- Dr Gustavo Lah    Past Surgical History  Procedure Laterality Date  . Total abdominal hysterectomy w/ bilateral salpingoophorectomy  2000  . Cataract extraction, bilateral  9/12, 1/13  . Bladder tack    . Vaginectomy colpocleisis cysto  2/16    Family History  Problem Relation Age of Onset  . Cancer Mother     Colon  . Cancer Father     Lung  . Diabetes      Paternal side  . Coronary artery disease Maternal Grandmother   . Coronary artery disease Paternal Grandmother   . Hypertension Mother     Social History   Social History  . Marital Status: Married    Spouse Name: N/A  . Number of Children: 3  . Years of Education: N/A   Occupational History  . 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 Smoking status: Never Smoker   . Smokeless tobacco: Never Used  . Alcohol Use: No     Comment: Rare  . Drug Use: No  . Sexual Activity: Not on file   Other Topics Concern  . Not on file  Social History Narrative   Has living will    Husband is health care POA   Would accept resuscitation attempts.   Would not want tube feeds if cognitively unaware   Review of Systems Appetite is good Weight is stable Sleeps well in general Wears seat belt Teeth fine--regular with dentist Some constipation--uses magnesium before bedtime with success usually Voids okay since last year's surgery No sig back pain or arthritis---rare knee or back acting up (but very short lived)    Objective:   Physical Exam  Constitutional: She is oriented to person, place, and time. She appears well-developed and well-nourished. No distress.  HENT:  Mouth/Throat:  Oropharynx is clear and moist. No oropharyngeal exudate.  Neck: Normal range of motion. Neck supple. No thyromegaly present.  Cardiovascular: Normal rate, regular rhythm, normal heart sounds and intact distal pulses.  Exam reveals no gallop.   No murmur heard. Pulmonary/Chest: Effort normal and breath sounds normal. No respiratory distress. She has no wheezes. She has no rales.  Abdominal: Soft. There is no tenderness.  Musculoskeletal: She exhibits no edema or tenderness.  Lymphadenopathy:    She has no cervical adenopathy.  Neurological: She is alert and oriented to person, place, and time.  President- "Trump, Obama, Bush" Doesn't do numbers D-l-r-o-w Recall 3/3  Skin: No rash noted. No erythema.  Psychiatric: She has a normal mood and affect. Her behavior is normal.          Assessment & Plan:

## 2015-11-22 NOTE — Assessment & Plan Note (Signed)
Rare anxiety spells Has the alprazolam for prn

## 2015-11-22 NOTE — Assessment & Plan Note (Signed)
Vitamin D  Asked her to restart her exercise Multiple past DEXAs--she will try to get records for me Wasn't comfortable with meds in past

## 2015-11-22 NOTE — Assessment & Plan Note (Signed)
See social history 

## 2015-11-22 NOTE — Assessment & Plan Note (Signed)
I have personally reviewed the Medicare Annual Wellness questionnaire and have noted 1. The patient's medical and social history 2. Their use of alcohol, tobacco or illicit drugs 3. Their current medications and supplements 4. The patient's functional ability including ADL's, fall risks, home safety risks and hearing or visual             impairment. 5. Diet and physical activities 6. Evidence for depression or mood disorders  The patients weight, height, BMI and visual acuity have been recorded in the chart I have made referrals, counseling and provided education to the patient based review of the above and I have provided the pt with a written personalized care plan for preventive services.  I have provided you with a copy of your personalized plan for preventive services. Please take the time to review along with your updated medication list.  Rx for zostavax again Done with screening colons Mammogram again 2019 --after discussion Plans to restart exercise

## 2015-12-16 ENCOUNTER — Ambulatory Visit: Payer: Self-pay | Admitting: Cardiovascular Disease

## 2015-12-28 ENCOUNTER — Encounter: Payer: Self-pay | Admitting: Cardiovascular Disease

## 2015-12-28 ENCOUNTER — Ambulatory Visit (INDEPENDENT_AMBULATORY_CARE_PROVIDER_SITE_OTHER): Payer: Medicare Other | Admitting: Cardiovascular Disease

## 2015-12-28 VITALS — BP 144/84 | HR 68 | Ht 65.0 in | Wt 152.2 lb

## 2015-12-28 DIAGNOSIS — I471 Supraventricular tachycardia: Secondary | ICD-10-CM | POA: Diagnosis not present

## 2015-12-28 DIAGNOSIS — Z0000 Encounter for general adult medical examination without abnormal findings: Secondary | ICD-10-CM | POA: Diagnosis not present

## 2015-12-28 DIAGNOSIS — I059 Rheumatic mitral valve disease, unspecified: Secondary | ICD-10-CM | POA: Diagnosis not present

## 2015-12-28 NOTE — Assessment & Plan Note (Signed)
For the most part symptoms resolved with metoprolol  Sometimes with tachycardia in the middle of the night relieved with Xanax  She feels the symptoms are from anxiety  Recommended extra propranolol only as needed for breakthrough tachycardia not relieved with Xanax

## 2015-12-28 NOTE — Assessment & Plan Note (Signed)
Cholesterol relatively well-controlled  Recommended a regular walking program

## 2015-12-28 NOTE — Progress Notes (Signed)
Patient ID: Denise Rhodes, female    DOB: 1940-12-20, 75 y.o.   MRN: [id number]  HPI Comments: Denise Rhodes is a 75 y.o. female w/ PMHx s/f anxiety, IBS and palpitations, seen in the past for worsening palpitations. She had a 48 hour Holter monitor on prior visit Prior history of possible mitral valve prolapse She presents today for follow-up of her palpitations   in follow-up today She presents with her husband  No regular exercise program, does stay active  Likes to go shopping  Occasionally wakes up in the middle the night with tachycardia, often symptoms relieved with Xanax  Husband feels this may be from anxiety  Has not ever had to take propranolol   Lab work reviewed with her showing total cholesterol 164, LDL 88  EKG on today's visit shows normal sinus rhythm with rate 68 bpm, no significant ST or T-wave changes  Other past medical history Prior monitor showed runs of atrial tachycardia, frequent APCs, sometimes in a bigeminal pattern.  Previous echo indicated EF 0000000, grade 1 diastolic dysfunction, structurally normal MV with normal leaflet separation, trivial MR.    No Known Allergies  Outpatient Encounter Prescriptions as of 12/28/2015  Medication Sig  . ALPRAZolam (XANAX) 0.25 MG tablet Take 1 tablet (0.25 mg total) by mouth 3 (three) times daily as needed.  . cholecalciferol (VITAMIN D) 1000 UNITS tablet Take 1,000 Units by mouth daily.  . Magnesium 250 MG TABS Take by mouth daily.  . metoprolol tartrate (LOPRESSOR) 25 MG tablet TAKE ONE TABLET BY MOUTH TWICE DAILY  . propranolol (INDERAL) 20 MG tablet Take 1 tablet (20 mg total) by mouth as needed (for heart palpitations).  . zoster vaccine live, PF, (ZOSTAVAX) 16109 UNT/0.65ML injection Inject 19,400 Units into the skin once.  . [DISCONTINUED] magnesium 30 MG tablet Take 250 mg by mouth daily as needed.   . [DISCONTINUED] acyclovir ointment (ZOVIRAX) 5 % Apply 6 application topically daily. For 7 days (Patient not  taking: Reported on 12/28/2015)   No facility-administered encounter medications on file as of 12/28/2015.    Past Medical History  Diagnosis Date  . Anxiety state, unspecified   . Mitral valve disorders   . Osteoporosis, unspecified   . Acute upper respiratory infections of unspecified site   . Urinary tract infection, site not specified   . Gilbert's syndrome   . H/O: pneumonia 09/2006  . IBS (irritable bowel syndrome)   . Esophagitis 7/15    and gastritis on EGD-- Dr Gustavo Lah    Past Surgical History  Procedure Laterality Date  . Total abdominal hysterectomy w/ bilateral salpingoophorectomy  2000  . Cataract extraction, bilateral  9/12, 1/13  . Bladder tack    . Vaginectomy colpocleisis cysto  2/16    Social History  reports that she has never smoked. She has never used smokeless tobacco. She reports that she does not drink alcohol or use illicit drugs.  Family History family history includes Cancer in her father and mother; Coronary artery disease in her maternal grandmother and paternal grandmother; Hypertension in her mother.   Review of Systems  Constitutional: Negative.   Respiratory: Negative.   Cardiovascular: Negative.   Gastrointestinal: Negative.   Musculoskeletal: Negative.   Skin: Negative.   Neurological: Negative.   Hematological: Negative.   Psychiatric/Behavioral: Negative.   All other systems reviewed and are negative.   BP 144/84 mmHg  Pulse 68  Ht 5\' 5"  (1.651 m)  Wt 152 lb 4 oz (69.06 kg)  BMI  25.34 kg/m2  Physical Exam  Constitutional: She is oriented to person, place, and time. She appears well-developed and well-nourished.  HENT:  Head: Normocephalic.  Nose: Nose normal.  Mouth/Throat: Oropharynx is clear and moist.  Eyes: Conjunctivae are normal. Pupils are equal, round, and reactive to light.  Neck: Normal range of motion. Neck supple. No JVD present.  Cardiovascular: Normal rate, regular rhythm, S1 normal, S2 normal, normal heart  sounds and intact distal pulses.  Exam reveals no gallop and no friction rub.   No murmur heard. Pulmonary/Chest: Effort normal and breath sounds normal. No respiratory distress. She has no wheezes. She has no rales. She exhibits no tenderness.  Abdominal: Soft. Bowel sounds are normal. She exhibits no distension. There is no tenderness.  Musculoskeletal: Normal range of motion. She exhibits no edema or tenderness.  Lymphadenopathy:    She has no cervical adenopathy.  Neurological: She is alert and oriented to person, place, and time. Coordination normal.  Skin: Skin is warm and dry. No rash noted. No erythema.  Psychiatric: She has a normal mood and affect. Her behavior is normal. Judgment and thought content normal.    Assessment and Plan  Nursing note and vitals reviewed.

## 2015-12-28 NOTE — Patient Instructions (Signed)
You are doing well. No medication changes were made.  Please call us if you have new issues that need to be addressed before your next appt.  Your physician wants you to follow-up in: 12 months.  You will receive a reminder letter in the mail two months in advance. If you don't receive a letter, please call our office to schedule the follow-up appointment. 

## 2016-05-22 ENCOUNTER — Ambulatory Visit (INDEPENDENT_AMBULATORY_CARE_PROVIDER_SITE_OTHER): Payer: Medicare Other

## 2016-05-22 DIAGNOSIS — Z23 Encounter for immunization: Secondary | ICD-10-CM

## 2016-10-01 DIAGNOSIS — M2011 Hallux valgus (acquired), right foot: Secondary | ICD-10-CM | POA: Diagnosis not present

## 2016-10-12 ENCOUNTER — Other Ambulatory Visit: Payer: Self-pay | Admitting: Cardiovascular Disease

## 2016-11-16 DIAGNOSIS — Z1231 Encounter for screening mammogram for malignant neoplasm of breast: Secondary | ICD-10-CM | POA: Diagnosis not present

## 2016-11-20 ENCOUNTER — Encounter: Payer: Self-pay | Admitting: Internal Medicine

## 2016-11-26 ENCOUNTER — Encounter: Payer: Self-pay | Admitting: Internal Medicine

## 2016-11-26 ENCOUNTER — Ambulatory Visit: Payer: Self-pay

## 2016-11-26 ENCOUNTER — Ambulatory Visit (INDEPENDENT_AMBULATORY_CARE_PROVIDER_SITE_OTHER): Payer: Medicare HMO | Admitting: Internal Medicine

## 2016-11-26 VITALS — BP 128/84 | HR 47 | Temp 98.0°F | Ht 64.5 in | Wt 150.2 lb

## 2016-11-26 DIAGNOSIS — Z0000 Encounter for general adult medical examination without abnormal findings: Secondary | ICD-10-CM | POA: Diagnosis not present

## 2016-11-26 DIAGNOSIS — F39 Unspecified mood [affective] disorder: Secondary | ICD-10-CM | POA: Diagnosis not present

## 2016-11-26 DIAGNOSIS — K219 Gastro-esophageal reflux disease without esophagitis: Secondary | ICD-10-CM | POA: Insufficient documentation

## 2016-11-26 DIAGNOSIS — R69 Illness, unspecified: Secondary | ICD-10-CM | POA: Diagnosis not present

## 2016-11-26 DIAGNOSIS — Z7189 Other specified counseling: Secondary | ICD-10-CM | POA: Diagnosis not present

## 2016-11-26 DIAGNOSIS — I471 Supraventricular tachycardia: Secondary | ICD-10-CM

## 2016-11-26 LAB — CBC WITH DIFFERENTIAL/PLATELET
BASOS ABS: 0.1 10*3/uL (ref 0.0–0.1)
Basophils Relative: 1 % (ref 0.0–3.0)
EOS PCT: 3.8 % (ref 0.0–5.0)
Eosinophils Absolute: 0.3 10*3/uL (ref 0.0–0.7)
HEMATOCRIT: 41.6 % (ref 36.0–46.0)
Hemoglobin: 13.6 g/dL (ref 12.0–15.0)
LYMPHS ABS: 2.4 10*3/uL (ref 0.7–4.0)
LYMPHS PCT: 34.9 % (ref 12.0–46.0)
MCHC: 32.6 g/dL (ref 30.0–36.0)
MCV: 89.7 fl (ref 78.0–100.0)
MONOS PCT: 8.9 % (ref 3.0–12.0)
Monocytes Absolute: 0.6 10*3/uL (ref 0.1–1.0)
NEUTROS ABS: 3.6 10*3/uL (ref 1.4–7.7)
NEUTROS PCT: 51.4 % (ref 43.0–77.0)
PLATELETS: 218 10*3/uL (ref 150.0–400.0)
RBC: 4.64 Mil/uL (ref 3.87–5.11)
RDW: 13.8 % (ref 11.5–15.5)
WBC: 6.9 10*3/uL (ref 4.0–10.5)

## 2016-11-26 LAB — COMPREHENSIVE METABOLIC PANEL
ALBUMIN: 4.1 g/dL (ref 3.5–5.2)
ALT: 25 U/L (ref 0–35)
AST: 23 U/L (ref 0–37)
Alkaline Phosphatase: 86 U/L (ref 39–117)
BILIRUBIN TOTAL: 1.3 mg/dL — AB (ref 0.2–1.2)
BUN: 15 mg/dL (ref 6–23)
CALCIUM: 9.8 mg/dL (ref 8.4–10.5)
CO2: 30 mEq/L (ref 19–32)
CREATININE: 0.63 mg/dL (ref 0.40–1.20)
Chloride: 104 mEq/L (ref 96–112)
GFR: 97.7 mL/min (ref >60.00)
Glucose, Bld: 89 mg/dL (ref 70–99)
Potassium: 4 mEq/L (ref 3.5–5.1)
SODIUM: 139 meq/L (ref 135–145)
Total Protein: 6.9 g/dL (ref 6.0–8.3)

## 2016-11-26 MED ORDER — TETANUS-DIPHTHERIA TOXOIDS TD 5-2 LFU IM INJ: 0.5000 mL | INJECTION | Freq: Once | INTRAMUSCULAR | 0 refills | Status: AC

## 2016-11-26 NOTE — Progress Notes (Signed)
Pre visit review using our clinic review tool, if applicable. No additional management support is needed unless otherwise documented below in the visit note. 

## 2016-11-26 NOTE — Assessment & Plan Note (Signed)
See social history 

## 2016-11-26 NOTE — Assessment & Plan Note (Signed)
Occasional anxiety spells Alprazolam very effective then

## 2016-11-26 NOTE — Assessment & Plan Note (Signed)
Will take 2 week omeprazole rarely for exacerbations

## 2016-11-26 NOTE — Assessment & Plan Note (Signed)
I have personally reviewed the Medicare Annual Wellness questionnaire and have noted 1. The patient's medical and social history 2. Their use of alcohol, tobacco or illicit drugs 3. Their current medications and supplements 4. The patient's functional ability including ADL's, fall risks, home safety risks and hearing or visual             impairment. 5. Diet and physical activities 6. Evidence for depression or mood disorders  The patients weight, height, BMI and visual acuity have been recorded in the chart I have made referrals, counseling and provided education to the patient based review of the above and I have provided the pt with a written personalized care plan for preventive services.  I have provided you with a copy of your personalized plan for preventive services. Please take the time to review along with your updated medication list.  Rx for Td to pharmacy Will consider shingrix next year No more colonoscopies Will probably continue mammograms every 2 years Discussed fitness, resistance training

## 2016-11-26 NOTE — Patient Instructions (Signed)
Please get your tetanus shot at [facility]

## 2016-11-26 NOTE — Progress Notes (Signed)
Subjective:    Patient ID: Denise Rhodes, female    DOB: [birth year], 76 y.o.   MRN: [id number]  HPI Here for Medicare wellness visit and follow up of chronic health conditions With husband Reviewed form and advanced directives Reviewed other doctor No alcohol or tobacco Not really exercising--- discussed Vision and hearing are okay No falls Independent with instrumental ADLs Mild memory problems---nothing worrisome  Still gets her anxiety at times Uses the alprazolam prn when anxiety acts up--very helpful No depression of note--- not anhedonic  No recent palpitations while taking metoprolol Occasionally forgets the afternoon dose No chest pain Hasn't needed the propranolol No dizziness or syncope Slight edema in the summer only  Current Outpatient Prescriptions on File Prior to Visit  Medication Sig Dispense Refill  . ALPRAZolam (XANAX) 0.25 MG tablet Take 1 tablet (0.25 mg total) by mouth 3 (three) times daily as needed. 30 tablet 0  . cholecalciferol (VITAMIN D) 1000 UNITS tablet Take 1,000 Units by mouth daily.    . Magnesium 250 MG TABS Take by mouth daily.    . metoprolol tartrate (LOPRESSOR) 25 MG tablet TAKE ONE TABLET BY MOUTH TWICE DAILY 180 tablet 1  . propranolol (INDERAL) 20 MG tablet Take 1 tablet (20 mg total) by mouth as needed (for heart palpitations). 60 tablet 3   No current facility-administered medications on file prior to visit.     No Known Allergies  Past Medical History:  Diagnosis Date  . Acute upper respiratory infections of unspecified site   . Anxiety state, unspecified   . Esophagitis 7/15   and gastritis on EGD-- Dr Gustavo Lah  . Gilbert's syndrome   . H/O: pneumonia 09/2006  . IBS (irritable bowel syndrome)   . Mitral valve disorders(424.0)   . Osteoporosis, unspecified   . Urinary tract infection, site not specified     Past Surgical History:  Procedure Laterality Date  . Bladder tack    . CATARACT EXTRACTION, BILATERAL  9/12,  1/13  . TOTAL ABDOMINAL HYSTERECTOMY W/ BILATERAL SALPINGOOPHORECTOMY  2000  . Vaginectomy colpocleisis cysto  2/16    Family History  Problem Relation Age of Onset  . Cancer Father     Lung  . Cancer Mother     Colon  . Hypertension Mother   . Diabetes      Paternal side  . Coronary artery disease Maternal Grandmother   . Coronary artery disease Paternal Grandmother     Social History   Social History  . Marital status: Married    Spouse name: N/A  . Number of children: 3  . Years of education: N/A   Occupational History  . 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 Smoking status: Never Smoker  . Smokeless tobacco: Never Used  . Alcohol use No     Comment: Rare  . Drug use: No  . Sexual activity: Not on file   Other Topics Concern  . Not on file   Social History Narrative   Has living will    Husband is health care POA   Would accept resuscitation attempts.   Would not want tube feeds if cognitively unaware    Review of Systems Some constipation--will get cramps then. Tries increased fiber and water. Prune juice No blood in stool Appetite is fine Weight stable Sleeps well Wears seat belt Keeps up with dentist--teeth okay Sharlett Iles) No urinary problems. Chronic urge incontinence---wears protection when out. No joint pains No rash  or suspicious lesions. No dermatologist lately No dysphagia. Occasional heartburn--will use PPI for 2 weeks when it acts up    Objective:   Physical Exam  Constitutional: She is oriented to person, place, and time. She appears well-nourished. No distress.  HENT:  Mouth/Throat: Oropharynx is clear and moist. No oropharyngeal exudate.  Neck: No thyromegaly present.  Cardiovascular: Normal rate, regular rhythm, normal heart sounds and intact distal pulses.  Exam reveals no gallop.   No murmur heard. Pulmonary/Chest: Effort normal and breath sounds normal. No respiratory distress. She has no  wheezes. She has no rales.  Abdominal: Soft. There is no tenderness.  Musculoskeletal: She exhibits no edema or tenderness.  Lymphadenopathy:    She has no cervical adenopathy.  Neurological: She is alert and oriented to person, place, and time.  President--"Trump, Obama, Bush" 100-  "math illiterate" D-l-r-o-w Recall 3/3  Skin: No rash noted. No erythema.  Psychiatric: She has a normal mood and affect. Her behavior is normal.          Assessment & Plan:

## 2016-11-26 NOTE — Assessment & Plan Note (Signed)
No obvious recurrence on the beta blocker Offered once daily succinate---she didn't want to change now

## 2017-01-06 NOTE — Progress Notes (Signed)
Cardiology Office Note  Date:  01/08/2017   ID:  Denise Rhodes, DOB 04/19/1941, MRN [id number]  PCP:  Venia Carbon, MD   Chief Complaint  Patient presents with  . OTHER    12 month f/u no complaints today. Meds reviewed verbally with pt.    HPI:  Denise Rhodes is a 76 y.o. female w/ PMHx  anxiety, Insomnia IBS  palpitations 48 hour Holter monitor:  runs of atrial tachycardia, frequent APCs, bigeminal pattern. possible mitral valve prolapse She presents today for follow-up of her palpitations  In general she reports that she feels well if she takes metoprolol 25 mg twice a day Denies any palpitations or tachycardia  No regular exercise program,  Likes to go shopping   Occasionally wakes up in the middle the night with tachycardia,  often symptoms relieved with Xanax  Lab work reviewed with her showing total cholesterol 164  EKG  shows normal sinus rhythm with rate 75 bpm no significant ST or T-wave changes  Other past medical history Prior monitor showed runs of atrial tachycardia, frequent APCs, sometimes in a bigeminal pattern.  Previous echo indicated EF 24-40%, grade 1 diastolic dysfunction, structurally normal MV with normal leaflet separation, trivial MR.   PMH:   has a past medical history of Acute upper respiratory infections of unspecified site; Anxiety state, unspecified; Esophagitis (7/15); Gilbert's syndrome; H/O: pneumonia (09/2006); IBS (irritable bowel syndrome); Mitral valve disorders(424.0); Osteoporosis, unspecified; and Urinary tract infection, site not specified.  PSH:    Past Surgical History:  Procedure Laterality Date  . Bladder tack    . CATARACT EXTRACTION, BILATERAL  9/12, 1/13  . TOTAL ABDOMINAL HYSTERECTOMY W/ BILATERAL SALPINGOOPHORECTOMY  2000  . Vaginectomy colpocleisis cysto  2/16    Current Outpatient Prescriptions  Medication Sig Dispense Refill  . ALPRAZolam (XANAX) 0.25 MG tablet Take 1 tablet (0.25 mg total) by mouth 3  (three) times daily as needed. 30 tablet 0  . cholecalciferol (VITAMIN D) 1000 UNITS tablet Take 1,000 Units by mouth daily.    . Magnesium 250 MG TABS Take by mouth daily.    . metoprolol tartrate (LOPRESSOR) 25 MG tablet Take 1 tablet (25 mg total) by mouth 2 (two) times daily. 180 tablet 3   No current facility-administered medications for this visit.      Allergies:   Patient has no known allergies.   Social History:  The patient  reports that she has never smoked. She has never used smokeless tobacco. She reports that she does not drink alcohol or use drugs.   Family History:   family history includes Cancer in her father and mother; Coronary artery disease in her maternal grandmother and paternal grandmother; Hypertension in her mother.    Review of Systems: Review of Systems  Constitutional: Negative.   Respiratory: Negative.   Cardiovascular: Negative.   Gastrointestinal: Negative.   Musculoskeletal: Negative.   Neurological: Negative.   Psychiatric/Behavioral: Negative.   All other systems reviewed and are negative.    PHYSICAL EXAM: VS:  BP 118/78 (BP Location: Left Arm, Patient Position: Sitting, Cuff Size: Normal)   Pulse 75   Ht 5\' 5"  (1.651 m)   Wt 153 lb 4 oz (69.5 kg)   BMI 25.50 kg/m  , BMI Body mass index is 25.5 kg/m. GEN: Well nourished, well developed, in no acute distress  HEENT: normal  Neck: no JVD, carotid bruits, or masses Cardiac: RRR; no murmurs, rubs, or gallops,no edema  Respiratory:  clear to auscultation bilaterally,  normal work of breathing GI: soft, nontender, nondistended, + BS MS: no deformity or atrophy  Skin: warm and dry, no rash Neuro:  Strength and sensation are intact Psych: euthymic mood, full affect    Recent Labs: 11/26/2016: ALT 25; BUN 15; Creatinine, Ser 0.63; Hemoglobin 13.6; Platelets 218.0; Potassium 4.0; Sodium 139    Lipid Panel Lab Results  Component Value Date   CHOL 164 11/16/2014   HDL 61.40 11/16/2014    LDLCALC 88 11/16/2014   TRIG 71.0 11/16/2014      Wt Readings from Last 3 Encounters:  01/08/17 153 lb 4 oz (69.5 kg)  11/26/16 150 lb 4 oz (68.2 kg)  12/28/15 152 lb 4 oz (69.1 kg)       ASSESSMENT AND PLAN:  Atrial tachycardia (HCC) - Plan: EKG 12-Lead Continue metoprolol 25 mg twice a day  Palpitations - Plan: EKG 12-Lead Denies having any recent symptoms. No further workup needed  Disposition:   F/U  12 months as needed    Orders Placed This Encounter  Procedures  . EKG 12-Lead     Signed, Esmond Plants, M.D., Ph.D. 01/08/2017  [place], [place]

## 2017-01-08 ENCOUNTER — Ambulatory Visit (INDEPENDENT_AMBULATORY_CARE_PROVIDER_SITE_OTHER): Payer: Medicare HMO | Admitting: Cardiovascular Disease

## 2017-01-08 ENCOUNTER — Encounter: Payer: Self-pay | Admitting: Cardiovascular Disease

## 2017-01-08 VITALS — BP 118/78 | HR 75 | Ht 65.0 in | Wt 153.2 lb

## 2017-01-08 DIAGNOSIS — I471 Supraventricular tachycardia: Secondary | ICD-10-CM | POA: Diagnosis not present

## 2017-01-08 DIAGNOSIS — R002 Palpitations: Secondary | ICD-10-CM

## 2017-01-08 MED ORDER — METOPROLOL TARTRATE 25 MG PO TABS: 25.0000 mg | ORAL_TABLET | Freq: Two times a day (BID) | ORAL | 3 refills | Status: DC

## 2017-01-08 NOTE — Patient Instructions (Addendum)
Medication Instructions:   No medication changes made  Labwork:  No new labs needed  Testing/Procedures:  No further testing at this time   I recommend watching educational videos on topics of interest to you at:       [email or web address]  Enter code: [email or web address]    Follow-Up: It was a pleasure seeing you in the office today. Please call us if you have new issues that need to be addressed before your next appt.  [phone]  Your physician wants you to follow-up in: 12 months as needed You will receive a reminder letter in the mail two months in advance. If you don't receive a letter, please call our office to schedule the follow-up appointment.  If you need a refill on your cardiac medications before your next appointment, please call your pharmacy.

## 2017-03-13 DIAGNOSIS — H43813 Vitreous degeneration, bilateral: Secondary | ICD-10-CM | POA: Diagnosis not present

## 2017-03-20 ENCOUNTER — Ambulatory Visit (INDEPENDENT_AMBULATORY_CARE_PROVIDER_SITE_OTHER): Payer: Medicare HMO | Admitting: Internal Medicine

## 2017-03-20 ENCOUNTER — Encounter: Payer: Self-pay | Admitting: Internal Medicine

## 2017-03-20 VITALS — BP 134/88 | HR 65 | Temp 98.0°F | Wt 151.0 lb

## 2017-03-20 DIAGNOSIS — N3000 Acute cystitis without hematuria: Secondary | ICD-10-CM

## 2017-03-20 DIAGNOSIS — R300 Dysuria: Secondary | ICD-10-CM | POA: Diagnosis not present

## 2017-03-20 DIAGNOSIS — N390 Urinary tract infection, site not specified: Secondary | ICD-10-CM | POA: Insufficient documentation

## 2017-03-20 LAB — POC URINALSYSI DIPSTICK (AUTOMATED)
BILIRUBIN UA: NEGATIVE
GLUCOSE UA: NEGATIVE
Ketones, UA: NEGATIVE
NITRITE UA: NEGATIVE
Protein, UA: NEGATIVE
RBC UA: NEGATIVE
Spec Grav, UA: 1.015 (ref 1.010–1.025)
Urobilinogen, UA: 0.2 E.U./dL
pH, UA: 8 (ref 5.0–8.0)

## 2017-03-20 MED ORDER — SULFAMETHOXAZOLE-TRIMETHOPRIM 800-160 MG PO TABS: 1.0000 | ORAL_TABLET | Freq: Two times a day (BID) | ORAL | 3 refills | Status: DC

## 2017-03-20 MED ORDER — ALPRAZOLAM 0.25 MG PO TABS: 0.2500 mg | ORAL_TABLET | Freq: Three times a day (TID) | ORAL | 0 refills | Status: DC | PRN

## 2017-03-20 NOTE — Patient Instructions (Signed)
Please call if your symptoms aren't better within 2 days.

## 2017-03-20 NOTE — Progress Notes (Signed)
   Subjective:    Patient ID: Denise Rhodes, female    DOB: 10-May-1941, 76 y.o.   MRN: [id number]  HPI Here due to urinary symptoms  Having pain with voiding Started around a week ago The pain may persist for a while even after voiding--suprapubic Worst in AM No blood No fever No back pain  Has had this before Had surgery 2016  No Rx for this  Current Outpatient Prescriptions on File Prior to Visit  Medication Sig Dispense Refill  . ALPRAZolam (XANAX) 0.25 MG tablet Take 1 tablet (0.25 mg total) by mouth 3 (three) times daily as needed. 30 tablet 0  . cholecalciferol (VITAMIN D) 1000 UNITS tablet Take 1,000 Units by mouth daily.    . Magnesium 250 MG TABS Take by mouth daily.    . metoprolol tartrate (LOPRESSOR) 25 MG tablet Take 1 tablet (25 mg total) by mouth 2 (two) times daily. 180 tablet 3   No current facility-administered medications on file prior to visit.     No Known Allergies  Past Medical History:  Diagnosis Date  . Acute upper respiratory infections of unspecified site   . Anxiety state, unspecified   . Esophagitis 7/15   and gastritis on EGD-- Dr Gustavo Lah  . Gilbert's syndrome   . H/O: pneumonia 09/2006  . IBS (irritable bowel syndrome)   . Mitral valve disorders(424.0)   . Osteoporosis, unspecified   . Urinary tract infection, site not specified     Past Surgical History:  Procedure Laterality Date  . Bladder tack    . CATARACT EXTRACTION, BILATERAL  9/12, 1/13  . TOTAL ABDOMINAL HYSTERECTOMY W/ BILATERAL SALPINGOOPHORECTOMY  2000  . Vaginectomy colpocleisis cysto  2/16    Family History  Problem Relation Age of Onset  . Cancer Father        Lung  . Cancer Mother        Colon  . Hypertension Mother   . Diabetes Unknown        Paternal side  . Coronary artery disease Maternal Grandmother   . Coronary artery disease Paternal Grandmother     Social History   Social History  . Marital status: Married    Spouse name: N/A  . Number of  children: 3  . Years of education: N/A   Occupational History  . 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 Smoking status: Never Smoker  . Smokeless tobacco: Never Used  . Alcohol use No     Comment: Rare  . Drug use: No  . Sexual activity: Not on file   Other Topics Concern  . Not on file   Social History Narrative   Has living will    Husband is health care POA   Would accept resuscitation attempts.   Would not want tube feeds if cognitively unaware   Review of Systems No N/V Appetite is okay    Objective:   Physical Exam  Abdominal: Soft. She exhibits no distension. There is no rebound and no guarding.  Very mild suprapubic tenderness  Musculoskeletal:  No CVA tenderness          Assessment & Plan:

## 2017-03-20 NOTE — Assessment & Plan Note (Signed)
Classic symptoms with 3+ leukocytes on urinalysis Discussed holding off on culture unless treatment failure Septra for 3 days Would change to macrobid if any problems

## 2017-03-22 ENCOUNTER — Other Ambulatory Visit: Payer: Self-pay | Admitting: Internal Medicine

## 2017-03-22 ENCOUNTER — Telehealth: Payer: Self-pay | Admitting: *Deleted

## 2017-03-22 MED ORDER — CIPROFLOXACIN HCL 250 MG PO TABS: 250.0000 mg | ORAL_TABLET | Freq: Two times a day (BID) | ORAL | 0 refills | Status: DC

## 2017-03-22 NOTE — Telephone Encounter (Signed)
No culture was done. Will try Cipro, sent to pharmacy

## 2017-03-22 NOTE — Telephone Encounter (Signed)
Patient called stating that she saw Dr. Silvio Pate and he is treating her for a UTI. Patient stated that Dr. Silvio Pate told her if she was not a lot better by today to call back and he would leave instructions on how to treat her if still having problems. Patient stated that she is some better, but still having pelvic pain. Pharmacy [facility].

## 2017-03-22 NOTE — Telephone Encounter (Signed)
Spoke to pt. She will keep [place] updated if no better with Cipro

## 2017-05-02 ENCOUNTER — Ambulatory Visit (INDEPENDENT_AMBULATORY_CARE_PROVIDER_SITE_OTHER): Payer: Medicare HMO

## 2017-05-02 DIAGNOSIS — Z23 Encounter for immunization: Secondary | ICD-10-CM | POA: Diagnosis not present

## 2017-06-04 ENCOUNTER — Other Ambulatory Visit: Payer: Self-pay | Admitting: Internal Medicine

## 2017-08-05 DIAGNOSIS — R1032 Left lower quadrant pain: Secondary | ICD-10-CM | POA: Diagnosis not present

## 2017-08-05 DIAGNOSIS — R197 Diarrhea, unspecified: Secondary | ICD-10-CM | POA: Diagnosis not present

## 2017-08-05 DIAGNOSIS — R1012 Left upper quadrant pain: Secondary | ICD-10-CM | POA: Diagnosis not present

## 2017-08-05 DIAGNOSIS — R1031 Right lower quadrant pain: Secondary | ICD-10-CM | POA: Diagnosis not present

## 2017-08-06 ENCOUNTER — Other Ambulatory Visit
Admission: RE | Admit: 2017-08-06 | Discharge: 2017-08-06 | Disposition: A | Discharge status: Home / Self Care | Payer: Medicare HMO | Source: Other Acute Inpatient Hospital | Attending: Gastroenterology | Admitting: Gastroenterology

## 2017-08-06 DIAGNOSIS — R1032 Left lower quadrant pain: Secondary | ICD-10-CM | POA: Diagnosis not present

## 2017-08-06 DIAGNOSIS — R1031 Right lower quadrant pain: Secondary | ICD-10-CM | POA: Insufficient documentation

## 2017-08-06 DIAGNOSIS — R197 Diarrhea, unspecified: Secondary | ICD-10-CM | POA: Insufficient documentation

## 2017-08-06 LAB — GASTROINTESTINAL PANEL BY PCR, STOOL (REPLACES STOOL CULTURE)

## 2017-08-20 ENCOUNTER — Telehealth: Payer: Self-pay | Admitting: Internal Medicine

## 2017-08-20 DIAGNOSIS — R3989 Other symptoms and signs involving the genitourinary system: Secondary | ICD-10-CM

## 2017-08-20 NOTE — Telephone Encounter (Signed)
Copied from [place] [id number]. Topic: Quick Communication - See Telephone Encounter >> Aug 20, 2017 10:48 AM Oneta Rack wrote: CRM for notification. See Telephone encounter for:   08/20/17.  Caller name: Gladue,Richard Relation to pt: spouse  Call back number: [phone] (H)  Reason for call:  Spouse requesting referral to urologist due to on going UTI symptoms such as  frequent or urgent urination, please advise

## 2017-08-20 NOTE — Telephone Encounter (Signed)
Referral placed.

## 2017-08-21 ENCOUNTER — Ambulatory Visit (INDEPENDENT_AMBULATORY_CARE_PROVIDER_SITE_OTHER): Payer: Medicare HMO | Admitting: Internal Medicine

## 2017-08-21 ENCOUNTER — Encounter: Payer: Self-pay | Admitting: Internal Medicine

## 2017-08-21 VITALS — BP 136/86 | HR 63 | Temp 97.5°F | Wt 152.0 lb

## 2017-08-21 DIAGNOSIS — R300 Dysuria: Secondary | ICD-10-CM | POA: Diagnosis not present

## 2017-08-21 LAB — POC URINALSYSI DIPSTICK (AUTOMATED)
Bilirubin, UA: NEGATIVE
Blood, UA: NEGATIVE
GLUCOSE UA: NEGATIVE
Ketones, UA: NEGATIVE
Nitrite, UA: NEGATIVE
Protein, UA: NEGATIVE
Spec Grav, UA: 1.01 (ref 1.010–1.025)
UROBILINOGEN UA: 0.2 U/dL
pH, UA: 6 (ref 5.0–8.0)

## 2017-08-21 MED ORDER — CIPROFLOXACIN HCL 250 MG PO TABS: 250.0000 mg | ORAL_TABLET | Freq: Two times a day (BID) | ORAL | 2 refills | Status: DC

## 2017-08-21 NOTE — Progress Notes (Signed)
Subjective:    Patient ID: Denise Rhodes, female    DOB: 1941-05-17, 77 y.o.   MRN: [id number]  HPI Here with husband due to dysuria  Started with some trouble Called in for referral here--but then spoke to her urogynecologist and made appt with her  On and off since Christmas--she has had some intermittent discomfort voiding Has held off on visit because comes and goes Knows she has "stress bladder" and trouble with retention (though better with the surgery)  Last visit for this was August Really bad symptoms for the past 3 days    Current Outpatient Medications on File Prior to Visit  Medication Sig Dispense Refill  . acyclovir ointment (ZOVIRAX) 5 % APPLY TOPICALLY 6 TIMES DAILY FOR 7 DAYS. 5 g 3  . ALPRAZolam (XANAX) 0.25 MG tablet Take 1 tablet (0.25 mg total) by mouth 3 (three) times daily as needed. 30 tablet 0  . cholecalciferol (VITAMIN D) 1000 UNITS tablet Take 1,000 Units by mouth daily.    . Magnesium 250 MG TABS Take by mouth daily.    . metoprolol tartrate (LOPRESSOR) 25 MG tablet Take 1 tablet (25 mg total) by mouth 2 (two) times daily. 180 tablet 3   No current facility-administered medications on file prior to visit.     No Known Allergies  Past Medical History:  Diagnosis Date  . Acute upper respiratory infections of unspecified site   . Anxiety state, unspecified   . Esophagitis 7/15   and gastritis on EGD-- Dr Gustavo Lah  . Gilbert's syndrome   . H/O: pneumonia 09/2006  . IBS (irritable bowel syndrome)   . Mitral valve disorders(424.0)   . Osteoporosis, unspecified   . Urinary tract infection, site not specified     Past Surgical History:  Procedure Laterality Date  . Bladder tack    . CATARACT EXTRACTION, BILATERAL  9/12, 1/13  . TOTAL ABDOMINAL HYSTERECTOMY W/ BILATERAL SALPINGOOPHORECTOMY  2000  . Vaginectomy colpocleisis cysto  2/16    Family History  Problem Relation Age of Onset  . Cancer Father        Lung  . Cancer Mother    Colon  . Hypertension Mother   . Diabetes Unknown        Paternal side  . Coronary artery disease Maternal Grandmother   . Coronary artery disease Paternal Grandmother     Social History   Socioeconomic History  . Marital status: Married    Spouse name: Not on file  . Number of children: 3  . Years of education: Not on file  . Highest education level: Not on file  Social Needs  . Financial resource strain: Not on file  . Food insecurity - worry: Not on file  . Food insecurity - inability: Not on file  . Transportation needs - medical: Not on file  . Transportation needs - non-medical: Not on file  Occupational History  . 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 Smoking status: Never Smoker  . Smokeless tobacco: Never Used  Substance and Sexual Activity  . Alcohol use: No    Alcohol/week: 0.0 oz    Comment: Rare  . Drug use: No  . Sexual activity: Not on file  Other Topics Concern  . Not on file  Social History Narrative   Has living will    Husband is health care POA   Would accept resuscitation attempts.   Would not want tube feeds if cognitively unaware   Review  of Systems No fever No back pain No N/V Appetite is fine    Objective:   Physical Exam  Abdominal: Soft. She exhibits no distension. There is no tenderness. There is no rebound and no guarding.  Suprapubic palpation causes urgency but not tenderness  Musculoskeletal:  No CVA tenderness           Assessment & Plan:

## 2017-08-21 NOTE — Addendum Note (Signed)
Addended by: Modena Nunnery on: 08/21/2017 01:21 PM   Modules accepted: Orders

## 2017-08-21 NOTE — Assessment & Plan Note (Signed)
Recurrent May be irritable bladder, change in operative anatomy or just recurrent cystitis Will refill the cipro (this worked but not the septra) and she is going back to the surgeon Check culture

## 2017-08-21 NOTE — Progress Notes (Signed)
yelkl

## 2017-08-24 LAB — URINE CULTURE
MICRO NUMBER:: 90066529
SPECIMEN QUALITY:: ADEQUATE

## 2017-08-30 DIAGNOSIS — Z961 Presence of intraocular lens: Secondary | ICD-10-CM | POA: Diagnosis not present

## 2017-09-06 DIAGNOSIS — D229 Melanocytic nevi, unspecified: Secondary | ICD-10-CM | POA: Diagnosis not present

## 2017-09-06 DIAGNOSIS — L821 Other seborrheic keratosis: Secondary | ICD-10-CM | POA: Diagnosis not present

## 2017-09-27 DIAGNOSIS — N390 Urinary tract infection, site not specified: Secondary | ICD-10-CM | POA: Diagnosis not present

## 2017-11-27 ENCOUNTER — Encounter: Payer: Self-pay | Admitting: Internal Medicine

## 2017-11-27 ENCOUNTER — Ambulatory Visit (INDEPENDENT_AMBULATORY_CARE_PROVIDER_SITE_OTHER): Payer: Medicare HMO | Admitting: Internal Medicine

## 2017-11-27 VITALS — BP 140/82 | HR 61 | Temp 98.0°F | Ht 64.0 in | Wt 151.0 lb

## 2017-11-27 DIAGNOSIS — R69 Illness, unspecified: Secondary | ICD-10-CM | POA: Diagnosis not present

## 2017-11-27 DIAGNOSIS — Z0000 Encounter for general adult medical examination without abnormal findings: Secondary | ICD-10-CM

## 2017-11-27 DIAGNOSIS — N390 Urinary tract infection, site not specified: Secondary | ICD-10-CM | POA: Diagnosis not present

## 2017-11-27 DIAGNOSIS — Z23 Encounter for immunization: Secondary | ICD-10-CM

## 2017-11-27 DIAGNOSIS — Z7189 Other specified counseling: Secondary | ICD-10-CM | POA: Diagnosis not present

## 2017-11-27 DIAGNOSIS — I471 Supraventricular tachycardia: Secondary | ICD-10-CM

## 2017-11-27 DIAGNOSIS — K219 Gastro-esophageal reflux disease without esophagitis: Secondary | ICD-10-CM | POA: Diagnosis not present

## 2017-11-27 DIAGNOSIS — F39 Unspecified mood [affective] disorder: Secondary | ICD-10-CM | POA: Diagnosis not present

## 2017-11-27 LAB — COMPREHENSIVE METABOLIC PANEL
ALT: 13 U/L (ref 0–35)
AST: 19 U/L (ref 0–37)
Albumin: 3.8 g/dL (ref 3.5–5.2)
Alkaline Phosphatase: 86 U/L (ref 39–117)
BUN: 16 mg/dL (ref 6–23)
CALCIUM: 9.3 mg/dL (ref 8.4–10.5)
CHLORIDE: 105 meq/L (ref 96–112)
CO2: 30 meq/L (ref 19–32)
Creatinine, Ser: 0.67 mg/dL (ref 0.40–1.20)
GFR: 90.76 mL/min (ref >60.00)
GLUCOSE: 90 mg/dL (ref 70–99)
POTASSIUM: 4.5 meq/L (ref 3.5–5.1)
Sodium: 140 mEq/L (ref 135–145)
Total Bilirubin: 1.1 mg/dL (ref 0.2–1.2)
Total Protein: 6.8 g/dL (ref 6.0–8.3)

## 2017-11-27 LAB — CBC
HCT: 40.3 % (ref 36.0–46.0)
HEMOGLOBIN: 13.4 g/dL (ref 12.0–15.0)
MCHC: 33.1 g/dL (ref 30.0–36.0)
MCV: 88.7 fl (ref 78.0–100.0)
PLATELETS: 228 10*3/uL (ref 150.0–400.0)
RBC: 4.55 Mil/uL (ref 3.87–5.11)
RDW: 13.8 % (ref 11.5–15.5)
WBC: 7.7 10*3/uL (ref 4.0–10.5)

## 2017-11-27 MED ORDER — TETANUS-DIPHTHERIA TOXOIDS TD 5-2 LFU IM INJ: 0.5000 mL | INJECTION | Freq: Once | INTRAMUSCULAR | 0 refills | Status: AC

## 2017-11-27 MED ORDER — ALPRAZOLAM 0.25 MG PO TABS: 0.2500 mg | ORAL_TABLET | Freq: Three times a day (TID) | ORAL | 0 refills | Status: DC | PRN

## 2017-11-27 MED ORDER — ESTRADIOL 0.1 MG/GM VA CREA: 1.0000 | TOPICAL_CREAM | VAGINAL | 12 refills | Status: DC

## 2017-11-27 NOTE — Assessment & Plan Note (Signed)
Chronic episodic anxiety Uses the alprazolam sporadically

## 2017-11-27 NOTE — Assessment & Plan Note (Signed)
Asked her to try estrogen cream again (generic) On cranberry now

## 2017-11-27 NOTE — Progress Notes (Signed)
Subjective:    Patient ID: Denise Rhodes, female    DOB: March 04, 1941, 77 y.o.   MRN: [id number]  HPI Here for Medicare wellness visit and follow up of chronic medical conditions Husband with her Reviewed form and advanced directives Reviewed other doctors No alcohol or tobacco No exercise---discussed again Vision is fine At most mild hearing issues--- trouble in a crowded room Independent with instrumental ADLs No sig memory issues--but some trouble with short term memory (recall issues)  Did see the urogynecologist No testing done Got estrogen cream to try twice a week---applied it twice and she got redness on her cheeks (so she stopped it). Discussed trying it again No recent infection Using cranberry capsules also  Some digestive problems Given pantoprazole for acid reflux Helps when she takes it Discussed every other day or intermittent  Still with episodic anxiety Alprazolam helps--doesn't need it much No depression or anhedonia  No recent palpitations Continues on the metoprolol--really helps No chest pain No SOB No dizziness or syncope No edema  Current Outpatient Medications on File Prior to Visit  Medication Sig Dispense Refill  . acyclovir ointment (ZOVIRAX) 5 % APPLY TOPICALLY 6 TIMES DAILY FOR 7 DAYS. 5 g 3  . ALPRAZolam (XANAX) 0.25 MG tablet Take 1 tablet (0.25 mg total) by mouth 3 (three) times daily as needed. 30 tablet 0  . Magnesium 250 MG TABS Take by mouth daily.    . metoprolol tartrate (LOPRESSOR) 25 MG tablet Take 1 tablet (25 mg total) by mouth 2 (two) times daily. 180 tablet 3  . pantoprazole (PROTONIX) 40 MG tablet Take by mouth.     No current facility-administered medications on file prior to visit.     No Known Allergies  Past Medical History:  Diagnosis Date  . Acute upper respiratory infections of unspecified site   . Anxiety state, unspecified   . Esophagitis 7/15   and gastritis on EGD-- Dr Gustavo Lah  . Gilbert's syndrome   .  H/O: pneumonia 09/2006  . IBS (irritable bowel syndrome)   . Mitral valve disorders(424.0)   . Osteoporosis, unspecified   . Urinary tract infection, site not specified     Past Surgical History:  Procedure Laterality Date  . Bladder tack    . CATARACT EXTRACTION, BILATERAL  9/12, 1/13  . TOTAL ABDOMINAL HYSTERECTOMY W/ BILATERAL SALPINGOOPHORECTOMY  2000  . Vaginectomy colpocleisis cysto  2/16    Family History  Problem Relation Age of Onset  . Cancer Father        Lung  . Cancer Mother        Colon  . Hypertension Mother   . Diabetes Unknown        Paternal side  . Coronary artery disease Maternal Grandmother   . Coronary artery disease Paternal Grandmother     Social History   Socioeconomic History  . Marital status: Married    Spouse name: Not on file  . Number of children: 3  . Years of education: Not on file  . Highest education level: Not on file  Occupational History  . 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 Financial resource strain: Not on file  . Food insecurity:    Worry: Not on file    Inability: Not on file  . Transportation needs:    Medical: Not on file    Non-medical: Not on file  Tobacco Use  . Smoking status: Never Smoker  . Smokeless tobacco: Never Used  Substance  and Sexual Activity  . Alcohol use: No    Alcohol/week: 0.0 oz    Comment: Rare  . Drug use: No  . Sexual activity: Not on file  Lifestyle  . Physical activity:    Days per week: Not on file    Minutes per session: Not on file  . Stress: Not on file  Relationships  . Social connections:    Talks on phone: Not on file    Gets together: Not on file    Attends religious service: Not on file    Active member of club or organization: Not on file    Attends meetings of clubs or organizations: Not on file    Relationship status: Not on file  . Intimate partner violence:    Fear of current or ex partner: Not on file    Emotionally abused: Not on file     Physically abused: Not on file    Forced sexual activity: Not on file  Other Topics Concern  . Not on file  Social History Narrative   Has living will    Husband is health care POA--- alternate is children    Would accept resuscitation attempts.   Would not want tube feeds if cognitively unaware   Review of Systems Appetite is fine Weight is stable Sleeps okay mostly Wears seat belt Teeth okay--regular with dentist Keeps up with dermatologist Bowels okay---chronic IBS but doubled up on magnesium. No blood No sig back or joint pain. Occasional left flank pain--?gas      Objective:   Physical Exam  Constitutional: She is oriented to person, place, and time. She appears well-developed. No distress.  HENT:  Mouth/Throat: Oropharynx is clear and moist. No oropharyngeal exudate.  Neck: No thyromegaly present.  Cardiovascular: Normal rate, regular rhythm, normal heart sounds and intact distal pulses. Exam reveals no gallop.  No murmur heard. Pulmonary/Chest: Effort normal and breath sounds normal. No respiratory distress. She has no wheezes. She has no rales.  Abdominal: Soft. There is no tenderness.  Musculoskeletal: She exhibits no edema or tenderness.  Lymphadenopathy:    She has no cervical adenopathy.  Neurological: She is alert and oriented to person, place, and time.  President-- "Denise Rhodes--- Obama" Doesn't do math D-l-r-o-w Recall 3/3  Skin: No rash noted. No erythema.  Psychiatric: She has a normal mood and affect. Her behavior is normal.          Assessment & Plan:

## 2017-11-27 NOTE — Assessment & Plan Note (Signed)
No recent symptoms Doing well on the beta blocker

## 2017-11-27 NOTE — Patient Instructions (Signed)
Please get your tetanus booster at the pharmacy. 

## 2017-11-27 NOTE — Assessment & Plan Note (Signed)
I have personally reviewed the Medicare Annual Wellness questionnaire and have noted 1. The patient's medical and social history 2. Their use of alcohol, tobacco or illicit drugs 3. Their current medications and supplements 4. The patient's functional ability including ADL's, fall risks, home safety risks and hearing or visual             impairment. 5. Diet and physical activities 6. Evidence for depression or mood disorders  The patients weight, height, BMI and visual acuity have been recorded in the chart I have made referrals, counseling and provided education to the patient based review of the above and I have provided the pt with a written personalized care plan for preventive services.  I have provided you with a copy of your personalized plan for preventive services. Please take the time to review along with your updated medication list.  Going for mammogram soon No more colonoscopies Rx for Td--never got last year Discussed exercise Consider shingrix when available

## 2017-11-27 NOTE — Assessment & Plan Note (Signed)
Discussed weaning or intermittent PPI

## 2017-11-27 NOTE — Addendum Note (Signed)
Addended by: Elmon Kirschner A on: 11/27/2017 11:01 AM   Modules accepted: Orders

## 2017-11-27 NOTE — Assessment & Plan Note (Signed)
See social history 

## 2017-11-28 ENCOUNTER — Encounter: Payer: Self-pay | Admitting: Internal Medicine

## 2017-11-28 ENCOUNTER — Telehealth: Payer: Self-pay | Admitting: Internal Medicine

## 2017-11-28 DIAGNOSIS — Z1231 Encounter for screening mammogram for malignant neoplasm of breast: Secondary | ICD-10-CM | POA: Diagnosis not present

## 2017-11-28 NOTE — Telephone Encounter (Signed)
Labs were viewed on MyChart per lab tab

## 2017-11-28 NOTE — Telephone Encounter (Signed)
I just released them on MyChart----all normal

## 2017-11-28 NOTE — Telephone Encounter (Signed)
Copied from [place] [id number]. Topic: Quick Communication - See Telephone Encounter >> Nov 28, 2017 10:34 AM Cleaster Corin, NT wrote: CRM for notification. See Telephone encounter for: 11/28/17.  Pt. Husband calling to receive wife lab results

## 2018-01-09 ENCOUNTER — Ambulatory Visit: Payer: Medicare HMO | Admitting: Cardiovascular Disease

## 2018-01-09 ENCOUNTER — Encounter: Payer: Self-pay | Admitting: Cardiovascular Disease

## 2018-01-09 VITALS — BP 118/78 | HR 79 | Ht 64.0 in | Wt 153.5 lb

## 2018-01-09 DIAGNOSIS — I059 Rheumatic mitral valve disease, unspecified: Secondary | ICD-10-CM | POA: Diagnosis not present

## 2018-01-09 DIAGNOSIS — R002 Palpitations: Secondary | ICD-10-CM

## 2018-01-09 DIAGNOSIS — I471 Supraventricular tachycardia: Secondary | ICD-10-CM

## 2018-01-09 MED ORDER — METOPROLOL SUCCINATE ER 25 MG PO TB24: 25.0000 mg | ORAL_TABLET | Freq: Every day | ORAL | 4 refills | Status: DC

## 2018-01-09 NOTE — Progress Notes (Signed)
Cardiology Office Note  Date:  01/09/2018   ID:  Denise Rhodes, DOB 1941-06-09, MRN [id number]  PCP:  Venia Carbon, MD   Chief Complaint  Patient presents with  . other    12 month f/u no complaints today. Meds reviewed verbally with pt.     HPI:  Denise Rhodes is a 77 y.o. female w/ PMHx  anxiety, Insomnia IBS  palpitations 48 hour Holter monitor:  runs of atrial tachycardia, frequent APCs, bigeminal pattern. possible mitral valve prolapse She presents today for follow-up of her palpitations  In general she reports that she feels well if she takes metoprolol 25 mg twice a day Sometimes forgets to take one of the doses during the day, uses a pillbox Denies any palpitations or tachycardia verall feels much better on metoprolol   No regular exercise program,  Likes to go shopping Denies any leg weakness  Periodically having issues with anxiety Used to wake up in the middle of the night symptoms relieved with Xanax in the past  Lab work reviewed with her showing total cholesterol 160  EKG  shows normal sinus rhythm with rate 79 bpm no significant ST or T-wave changes  Other past medical history Prior monitor showed runs of atrial tachycardia, frequent APCs, sometimes in a bigeminal pattern.  Previous echo indicated EF 95-62%, grade 1 diastolic dysfunction, structurally normal MV with normal leaflet separation, trivial MR.   PMH:   has a past medical history of Acute upper respiratory infections of unspecified site, Anxiety state, unspecified, Esophagitis (7/15), Gilbert's syndrome, H/O: pneumonia (09/2006), IBS (irritable bowel syndrome), Mitral valve disorders(424.0), Osteoporosis, unspecified, and Urinary tract infection, site not specified.  PSH:    Past Surgical History:  Procedure Laterality Date  . Bladder tack    . CATARACT EXTRACTION, BILATERAL  9/12, 1/13  . TOTAL ABDOMINAL HYSTERECTOMY W/ BILATERAL SALPINGOOPHORECTOMY  2000  . Vaginectomy colpocleisis  cysto  2/16    Current Outpatient Medications  Medication Sig Dispense Refill  . acyclovir ointment (ZOVIRAX) 5 % APPLY TOPICALLY 6 TIMES DAILY FOR 7 DAYS. 5 g 3  . ALPRAZolam (XANAX) 0.25 MG tablet Take 1 tablet (0.25 mg total) by mouth 3 (three) times daily as needed. 30 tablet 0  . estradiol (ESTRACE) 0.1 MG/GM vaginal cream Place 1 Applicatorful vaginally 3 (three) times a week. (Patient taking differently: Place 1 Applicatorful vaginally as needed. ) 42.5 g 12  . Magnesium 250 MG TABS Take by mouth as needed.     . metoprolol tartrate (LOPRESSOR) 25 MG tablet Take 1 tablet (25 mg total) by mouth 2 (two) times daily. 180 tablet 3  . pantoprazole (PROTONIX) 40 MG tablet Take by mouth.     No current facility-administered medications for this visit.      Allergies:   Patient has no known allergies.   Social History:  The patient  reports that she has never smoked. She has never used smokeless tobacco. She reports that she does not drink alcohol or use drugs.   Family History:   family history includes Cancer in her father and mother; Coronary artery disease in her maternal grandmother and paternal grandmother; Diabetes in her unknown relative; Hypertension in her mother.    Review of Systems: Review of Systems  Constitutional: Negative.   Respiratory: Negative.   Cardiovascular: Negative.   Gastrointestinal: Negative.   Musculoskeletal: Negative.   Neurological: Negative.   Psychiatric/Behavioral: Negative.   All other systems reviewed and are negative.    PHYSICAL EXAM: VS:  BP 118/78 (BP Location: Left Arm, Patient Position: Sitting, Cuff Size: Normal)   Pulse 79   Ht 5\' 4"  (1.626 m)   Wt 153 lb 8 oz (69.6 kg)   BMI 26.35 kg/m  , BMI Body mass index is 26.35 kg/m. Constitutional:  oriented to person, place, and time. No distress.  HENT:  Head: Normocephalic and atraumatic.  Eyes:  no discharge. No scleral icterus.  Neck: Normal range of motion. Neck supple. No JVD  present.  Cardiovascular: Normal rate, regular rhythm, normal heart sounds and intact distal pulses. Exam reveals no gallop and no friction rub. No edema No murmur heard. Pulmonary/Chest: Effort normal and breath sounds normal. No stridor. No respiratory distress.  no wheezes.  no rales.  no tenderness.  Abdominal: Soft.  no distension.  no tenderness.  Musculoskeletal: Normal range of motion.  no  tenderness or deformity.  Neurological:  normal muscle tone. Coordination normal. No atrophy Skin: Skin is warm and dry. No rash noted. not diaphoretic.  Psychiatric:  normal mood and affect. behavior is normal. Thought content normal.    Recent Labs: 11/27/2017: ALT 13; BUN 16; Creatinine, Ser 0.67; Hemoglobin 13.4; Platelets 228.0; Potassium 4.5; Sodium 140    Lipid Panel Lab Results  Component Value Date   CHOL 164 11/16/2014   HDL 61.40 11/16/2014   LDLCALC 88 11/16/2014   TRIG 71.0 11/16/2014      Wt Readings from Last 3 Encounters:  01/09/18 153 lb 8 oz (69.6 kg)  11/27/17 151 lb (68.5 kg)  08/21/17 152 lb (68.9 kg)       ASSESSMENT AND PLAN:  Atrial tachycardia ([place]) - Plan: EKG 12-Lead Doing well on metoprolol heart rate though missing some doses For medication compliance we will change to metoprolol succinate 25 mg daily If this does not control her arrhythmia we could increase the dose  Palpitations - Plan: EKG 12-Lead Doing well, no further workup Will changed to metoprolol succinate as above  Disposition:   F/U  12 months as needed    No orders of the defined types were placed in this encounter.    Signed, Esmond Plants, M.D., Ph.D. 01/09/2018  [place], [place]

## 2018-01-09 NOTE — Patient Instructions (Addendum)
Medication Instructions:   Hold the metoprolol tartrate  Start metoprolol succinate one a day in the Am  Labwork:  No new labs needed  Testing/Procedures:  No further testing at this time   Follow-Up: It was a pleasure seeing you in the office today. Please call us if you have new issues that need to be addressed before your next appt.  [phone]  Your physician wants you to follow-up in: 12 months as needed.  You will receive a reminder letter in the mail two months in advance. If you don't receive a letter, please call our office to schedule the follow-up appointment.  If you need a refill on your cardiac medications before your next appointment, please call your pharmacy.  For educational health videos Log in to : [email or web address] Or : [email or web address], password : [email or web address]

## 2018-01-14 NOTE — Addendum Note (Signed)
Addended by: Britt Bottom on: 01/14/2018 09:09 AM   Modules accepted: Orders

## 2018-03-17 ENCOUNTER — Ambulatory Visit (INDEPENDENT_AMBULATORY_CARE_PROVIDER_SITE_OTHER): Payer: Medicare HMO | Admitting: Family Medicine

## 2018-03-17 ENCOUNTER — Encounter: Payer: Self-pay | Admitting: Family Medicine

## 2018-03-17 VITALS — BP 126/94 | HR 71 | Temp 98.6°F | Ht 65.0 in | Wt 151.8 lb

## 2018-03-17 DIAGNOSIS — N3000 Acute cystitis without hematuria: Secondary | ICD-10-CM

## 2018-03-17 DIAGNOSIS — R300 Dysuria: Secondary | ICD-10-CM | POA: Diagnosis not present

## 2018-03-17 LAB — POCT URINALYSIS DIPSTICK
BILIRUBIN UA: NEGATIVE
Blood, UA: NEGATIVE
GLUCOSE UA: NEGATIVE
KETONES UA: NEGATIVE
Nitrite, UA: NEGATIVE
PH UA: 6 (ref 5.0–8.0)
Protein, UA: NEGATIVE
Spec Grav, UA: 1.01 (ref 1.010–1.025)
UROBILINOGEN UA: 0.2 U/dL

## 2018-03-17 MED ORDER — SULFAMETHOXAZOLE-TRIMETHOPRIM 800-160 MG PO TABS: 1.0000 | ORAL_TABLET | Freq: Two times a day (BID) | ORAL | 0 refills | Status: AC

## 2018-03-17 NOTE — Progress Notes (Signed)
   Subjective:    Patient ID: Denise Rhodes, female    DOB: [birth year], 77 y.o.   MRN: [id number]  HPI  Patient presents to clinic with painful urination, urgency and frequency building up over past 2-3 days.  Has had issues with frequent UTIs in past and has seen urology specialist - last saw was early 2019.  Denies fever or chills.    Patient Active Problem List   Diagnosis Date Noted  . Recurrent UTI 03/20/2017  . GERD (gastroesophageal reflux disease) 11/26/2016  . Advance directive discussed with patient 11/16/2014  . Atrial tachycardia ([place]) 06/10/2013  . Routine general medical examination at a health care facility 11/21/2011  . IBS (irritable bowel syndrome)   . Episodic mood disorder ([place]) 03/20/2007  . Mitral valve disorder 03/20/2007  . Osteoporosis 03/20/2007    Social History   Tobacco Use  . Smoking status: Never Smoker  . Smokeless tobacco: Never Used  Substance Use Topics  . Alcohol use: No    Alcohol/week: 0.0 standard drinks    Comment: Rare   Review of Systems   Constitutional: Negative for chills, fatigue and fever.  HENT: Negative for congestion, ear pain, sinus pain and sore throat.   Eyes: Negative.   Respiratory: Negative for cough, shortness of breath and wheezing.   Cardiovascular: Negative for chest pain, palpitations and leg swelling.  Gastrointestinal: Negative for abdominal pain, diarrhea, nausea and vomiting.  Genitourinary: Positive for dysuria, frequency and urgency.  Musculoskeletal: Negative for arthralgias and myalgias.  Skin: Negative for color change, pallor and rash.  Neurological: Negative for syncope, light-headedness and headaches.  Psychiatric/Behavioral: The patient is not nervous/anxious.    Objective:   Physical Exam  Physical Exam  Constitutional: She is oriented to person, place, and time. She appears well-developed and well-nourished. No distress.  HENT:  Head: Normocephalic and atraumatic.  Neck: Normal range of  motion. Neck supple. No tracheal deviation present.  Cardiovascular: Normal rate, regular rhythm and normal heart sounds.  Pulmonary/Chest: Effort normal and breath sounds normal. No respiratory distress. She has no wheezes. She has no rales.  Abdominal: Soft. Bowel sounds are normal. Mild suprapubic tenderness. Neurological: She is alert and oriented to person, place, and time.  Gait normal  Skin: Skin is warm and dry. No pallor.  Psychiatric: She has a normal mood and affect. Her behavior is normal.  Nursing note and vitals reviewed.     Vitals:   03/17/18 1523  BP: (!) 126/94  Pulse: 71  Temp: 98.6 F (37 C)  SpO2: 96%    Assessment & Plan:   UTI - Bactrim BID for 5 days. Increase water. Avoid excess sugary or caffeine type drinks.  Dysuria - Can use OTC AZO as needed.   Follow up if symptoms persist or worsen. Keep regular follow ups with PCP

## 2018-03-17 NOTE — Patient Instructions (Signed)
Great to meet you!  Keep up good water intake. Avoid excess sugar/caffeine in drinks as this can make UTI symptoms worse

## 2018-03-18 ENCOUNTER — Encounter: Payer: Self-pay | Admitting: Family Medicine

## 2018-03-18 LAB — URINE CULTURE
MICRO NUMBER:: 90953041
SPECIMEN QUALITY:: ADEQUATE

## 2018-03-19 DIAGNOSIS — M7918 Myalgia, other site: Secondary | ICD-10-CM | POA: Diagnosis not present

## 2018-03-19 DIAGNOSIS — R102 Pelvic and perineal pain: Secondary | ICD-10-CM | POA: Diagnosis not present

## 2018-03-19 DIAGNOSIS — R8290 Unspecified abnormal findings in urine: Secondary | ICD-10-CM | POA: Diagnosis not present

## 2018-03-19 DIAGNOSIS — N952 Postmenopausal atrophic vaginitis: Secondary | ICD-10-CM | POA: Diagnosis not present

## 2018-03-26 DIAGNOSIS — H11003 Unspecified pterygium of eye, bilateral: Secondary | ICD-10-CM | POA: Diagnosis not present

## 2018-04-04 DIAGNOSIS — R102 Pelvic and perineal pain: Secondary | ICD-10-CM | POA: Diagnosis not present

## 2018-04-11 DIAGNOSIS — R300 Dysuria: Secondary | ICD-10-CM | POA: Diagnosis not present

## 2018-04-29 ENCOUNTER — Telehealth: Payer: Self-pay | Admitting: Internal Medicine

## 2018-04-29 MED ORDER — NITROFURANTOIN MONOHYD MACRO 100 MG PO CAPS: 100.0000 mg | ORAL_CAPSULE | Freq: Two times a day (BID) | ORAL | 2 refills | Status: DC

## 2018-04-29 NOTE — Telephone Encounter (Signed)
Copied from [place] [id number]. Topic: Quick Communication - See Telephone Encounter >> Apr 29, 2018 11:40 AM Ahmed Prima L wrote: CRM for notification. See Telephone encounter for: 04/29/18.  Patient's husband, Delfino Lovett called and said he is pretty sure she has another UTI, her symptoms are burning when urinating along with frequency. Dr Sharlett Iles in [facility] (  urogynecology ) advised him to call and see if the office would be willing to let her come in and just give a sample to the lab, and it be sent off so that he does not have to drive all the way to [facility]. Please contact Richard.

## 2018-04-29 NOTE — Telephone Encounter (Signed)
Pts husband called to check on medication being sent to the pharmacy. Read below message and husband states he will go by and pick up the medication.

## 2018-04-29 NOTE — Telephone Encounter (Signed)
Given that the last culture didn't show anything, I am not sure we need to send it again. I have sent a prescription for an antibiotic for her to try---3 days. That is usually enough if this is just a bladder infection. If it doesn't respond, then I would send the culture. I did put refills on so she can treat for recurrent symptoms

## 2018-05-02 DIAGNOSIS — R8290 Unspecified abnormal findings in urine: Secondary | ICD-10-CM | POA: Diagnosis not present

## 2018-05-06 DIAGNOSIS — M62838 Other muscle spasm: Secondary | ICD-10-CM | POA: Diagnosis not present

## 2018-05-20 DIAGNOSIS — R69 Illness, unspecified: Secondary | ICD-10-CM | POA: Diagnosis not present

## 2018-06-03 DIAGNOSIS — M62838 Other muscle spasm: Secondary | ICD-10-CM | POA: Diagnosis not present

## 2018-09-05 DIAGNOSIS — R8290 Unspecified abnormal findings in urine: Secondary | ICD-10-CM | POA: Diagnosis not present

## 2018-09-30 DIAGNOSIS — R300 Dysuria: Secondary | ICD-10-CM | POA: Diagnosis not present

## 2018-09-30 DIAGNOSIS — R8290 Unspecified abnormal findings in urine: Secondary | ICD-10-CM | POA: Diagnosis not present

## 2018-10-15 ENCOUNTER — Telehealth: Payer: Self-pay

## 2018-10-15 NOTE — Telephone Encounter (Signed)
Mr Bachicha [facility] signed) called to see if could get vaginal cream sent to [facility] [address]. Estradiol is on med list and I spoke with Tania at [address] and pt has available refills and will get rx ready for pick up after 1 pm. Mr Pro appreciative and voiced understanding.

## 2018-11-26 ENCOUNTER — Encounter: Payer: Self-pay | Admitting: Family Medicine

## 2018-11-26 ENCOUNTER — Ambulatory Visit (INDEPENDENT_AMBULATORY_CARE_PROVIDER_SITE_OTHER): Payer: Medicare HMO | Admitting: Family Medicine

## 2018-11-26 ENCOUNTER — Other Ambulatory Visit: Payer: Self-pay

## 2018-11-26 ENCOUNTER — Other Ambulatory Visit: Payer: Medicare HMO

## 2018-11-26 DIAGNOSIS — N390 Urinary tract infection, site not specified: Secondary | ICD-10-CM | POA: Insufficient documentation

## 2018-11-26 DIAGNOSIS — R300 Dysuria: Secondary | ICD-10-CM | POA: Diagnosis not present

## 2018-11-26 DIAGNOSIS — N3000 Acute cystitis without hematuria: Secondary | ICD-10-CM

## 2018-11-26 LAB — POC URINALSYSI DIPSTICK (AUTOMATED)
Bilirubin, UA: NEGATIVE
Glucose, UA: NEGATIVE
Ketones, UA: NEGATIVE
Nitrite, UA: NEGATIVE
Protein, UA: NEGATIVE
Spec Grav, UA: 1.01 (ref 1.010–1.025)
Urobilinogen, UA: 0.2 E.U./dL
pH, UA: 7 (ref 5.0–8.0)

## 2018-11-26 MED ORDER — CEPHALEXIN 500 MG PO CAPS: 500.0000 mg | ORAL_CAPSULE | Freq: Two times a day (BID) | ORAL | 0 refills | Status: DC

## 2018-11-26 NOTE — Progress Notes (Signed)
Virtual Visit via Video Note  I connected with Denise Rhodes on 11/26/18 at  2:30 PM EDT by a video enabled telemedicine application and verified that I am speaking with the correct person using two identifiers. The patient is in her home today  I am in my office at [place]   I discussed the limitations of evaluation and management by telemedicine and the availability of in person appointments. The patient expressed understanding and agreed to proceed.  History of Present Illness: Here for urinary symptoms - for just under a month   The last few years -frequent utis (went to [facility] for this)  They did routine cultures   Per chart-had klebsiella uti in feb -resistant to sulfa and intermed to macrobid  Symptoms this time are very mild  Unsure if she has one -but may  Pain to urinate  Also bladder pressure  Frequent urination /urgency   Last few days - she is drinking more water  Urine was cloudy- clearer today  No blood in urine  No flank pain now (some back discomfort last night-? Related)   No fever No nausea  No drug allergies   Results for orders placed or performed in visit on 11/26/18  POCT Urinalysis Dipstick (Automated)  Result Value Ref Range   Color, UA pale    Clarity, UA hazy    Glucose, UA Negative Negative   Bilirubin, UA neg    Ketones, UA neg    Spec Grav, UA 1.010 1.010 - 1.025   Blood, UA small    pH, UA 7.0 5.0 - 8.0   Protein, UA Negative Negative   Urobilinogen, UA 0.2 0.2 or 1.0 E.U./dL   Nitrite, UA neg    Leukocytes, UA Moderate (2+) (A) Negative     Review of Systems  Constitutional: Positive for malaise/fatigue. Negative for chills and fever.  Respiratory: Negative for cough and shortness of breath.   Cardiovascular: Negative for chest pain and palpitations.  Gastrointestinal: Negative for nausea and vomiting.  Genitourinary: Positive for dysuria, frequency and urgency. Negative for flank pain and hematuria.  Skin: Negative for  rash.  Neurological: Negative for headaches.  Psychiatric/Behavioral: The patient is nervous/anxious.      Patient Active Problem List   Diagnosis Date Noted  . UTI (urinary tract infection) 11/26/2018  . Recurrent UTI 03/20/2017  . GERD (gastroesophageal reflux disease) 11/26/2016  . Advance directive discussed with patient 11/16/2014  . Atrial tachycardia ([place]) 06/10/2013  . Routine general medical examination at a health care facility 11/21/2011  . IBS (irritable bowel syndrome)   . Episodic mood disorder ([place]) 03/20/2007  . Mitral valve disorder 03/20/2007  . Osteoporosis 03/20/2007   Past Medical History:  Diagnosis Date  . Acute upper respiratory infections of unspecified site   . Anxiety state, unspecified   . Esophagitis 7/15   and gastritis on EGD-- Dr Gustavo Lah  . Gilbert's syndrome   . H/O: pneumonia 09/2006  . IBS (irritable bowel syndrome)   . Mitral valve disorders(424.0)   . Osteoporosis, unspecified   . Urinary tract infection, site not specified    Past Surgical History:  Procedure Laterality Date  . Bladder tack    . CATARACT EXTRACTION, BILATERAL  9/12, 1/13  . TOTAL ABDOMINAL HYSTERECTOMY W/ BILATERAL SALPINGOOPHORECTOMY  2000  . Vaginectomy colpocleisis cysto  2/16   Social History   Tobacco Use  . Smoking status: Never Smoker  . Smokeless tobacco: Never Used  Substance Use Topics  . Alcohol  use: No    Alcohol/week: 0.0 standard drinks    Comment: Rare  . Drug use: No   Family History  Problem Relation Age of Onset  . Cancer Father        Lung  . Cancer Mother        Colon  . Hypertension Mother   . Diabetes Unknown        Paternal side  . Coronary artery disease Maternal Grandmother   . Coronary artery disease Paternal Grandmother    No Known Allergies Current Outpatient Medications on File Prior to Visit  Medication Sig Dispense Refill  . acyclovir ointment (ZOVIRAX) 5 % APPLY TOPICALLY 6 TIMES DAILY FOR 7 DAYS. 5 g 3  . ALPRAZolam  (XANAX) 0.25 MG tablet Take 1 tablet (0.25 mg total) by mouth 3 (three) times daily as needed. 30 tablet 0  . estradiol (ESTRACE) 0.1 MG/GM vaginal cream Place 1 Applicatorful vaginally 3 (three) times a week. (Patient taking differently: Place 1 Applicatorful vaginally as needed. ) 42.5 g 12  . Magnesium 250 MG TABS Take by mouth as needed.     . metoprolol succinate (TOPROL XL) 25 MG 24 hr tablet Take 1 tablet (25 mg total) by mouth daily. 90 tablet 4  . nitrofurantoin, macrocrystal-monohydrate, (MACROBID) 100 MG capsule Take 1 capsule (100 mg total) by mouth 2 (two) times daily. 6 capsule 2  . pantoprazole (PROTONIX) 40 MG tablet Take by mouth.     No current facility-administered medications on file prior to visit.     Observations/Objective: Patient appeared well  Affect is mildly anxious (but pleasant)  No facial swelling or asymmetry Normal voice Not hoarse and no cough  Mentally sharp and talkative (husband helps with history)  Assessment and Plan: Problem List Items Addressed This Visit      Genitourinary   UTI (urinary tract infection) - Primary    In the setting of recurrent uti (seen at [facility]) Last UC in feb was Klebsiella with resistance to bactrim (intermed to macrobid)  ua is pos for leukocytes today Will cx it Enc fluid intake/water Px keflex 500 mg bid pending culture result  Update if not starting to improve in several or if worsening  (esp if flank pain or fever or nausea)       Relevant Medications   cephALEXin (KEFLEX) 500 MG capsule   Other Relevant Orders   Urine Culture    Other Visit Diagnoses    Dysuria       Relevant Orders   POCT Urinalysis Dipstick (Automated) (Completed)       Follow Up Instructions: Drink water and make a habit of good hydration  Take the keflex as directed  Update if not starting to improve in several days or if worsening   We will call/ message you with a culture result when it returns   I discussed the assessment and  treatment plan with the patient. The patient was provided an opportunity to ask questions and all were answered. The patient agreed with the plan and demonstrated an understanding of the instructions.   The patient was advised to call back or seek an in-person evaluation if the symptoms worsen or if the condition fails to improve as anticipated.     Loura Pardon, MD

## 2018-11-26 NOTE — Assessment & Plan Note (Signed)
In the setting of recurrent uti (seen at [facility]) Last UC in feb was Klebsiella with resistance to bactrim (intermed to macrobid)  ua is pos for leukocytes today Will cx it Enc fluid intake/water Px keflex 500 mg bid pending culture result  Update if not starting to improve in several or if worsening  (esp if flank pain or fever or nausea)

## 2018-11-28 ENCOUNTER — Telehealth: Payer: Self-pay | Admitting: Family Medicine

## 2018-11-28 LAB — URINE CULTURE
MICRO NUMBER:: 413866
SPECIMEN QUALITY:: ADEQUATE

## 2018-11-28 MED ORDER — SULFAMETHOXAZOLE-TRIMETHOPRIM 800-160 MG PO TABS: 1.0000 | ORAL_TABLET | Freq: Two times a day (BID) | ORAL | 0 refills | Status: DC

## 2018-11-28 NOTE — Telephone Encounter (Signed)
Message left for patient to return my call.  

## 2018-11-28 NOTE — Telephone Encounter (Signed)
Urine cx grew bacteria (klebsiella) that is most likely resistant to keflex Stop this and start the bactrim ds I sent in  Let me know if no clinical improvement with this  Thanks

## 2018-11-28 NOTE — Telephone Encounter (Signed)
Spoken and notified patient's husband of Dr Marliss Coots comments. Patient's husband verbalized understanding.

## 2018-12-05 ENCOUNTER — Encounter: Payer: Medicare HMO | Admitting: Internal Medicine

## 2018-12-08 ENCOUNTER — Telehealth: Payer: Self-pay | Admitting: Family Medicine

## 2018-12-08 MED ORDER — SULFAMETHOXAZOLE-TRIMETHOPRIM 800-160 MG PO TABS: 1.0000 | ORAL_TABLET | Freq: Two times a day (BID) | ORAL | 0 refills | Status: DC

## 2018-12-08 NOTE — Telephone Encounter (Signed)
Patient was diagnosed with a UTI on 11/28/18.  Patient finished antibiotic.  Patient said she's having a problem with retention. She said it's hard for her to void and she has an urgency.  She said certain times of the day she can't void. Patient uses [employer]

## 2018-12-08 NOTE — Telephone Encounter (Signed)
How are her other symptoms (burning/ etc)  Is she drinking lots of water?  Any blood in her urine?

## 2018-12-08 NOTE — Telephone Encounter (Signed)
Discussed situation Culture not >100,000 Some discomfort but concern about adequate emptying I will refill the septra I asked her to contact her urologist at [facility] to discuss her situation and decide on a next step She is considering transferring to Dr Yves Dill here in [place]---will let me know if she decides to do this and needs my help

## 2018-12-08 NOTE — Telephone Encounter (Signed)
Patient states that she has not had any burning but mostly internal discomfort when trying to void.  Has pain, soreness and is uncomfortable when trying to finish voiding.  She feels like she can't completely eliminate and it hurts. Has to go about every 5-59minutes to finish expressing all of the urine before finally giving herself some relief.  Increasing her fluid intake makes her discomfort worse because when her bladder is full it makes the pain and pressure worse.    She denies seeing any visible amounts of blood in urine but has had noted microscopic blood in past urine results.

## 2018-12-11 DIAGNOSIS — N398 Other specified disorders of urinary system: Secondary | ICD-10-CM | POA: Diagnosis not present

## 2019-01-06 ENCOUNTER — Telehealth: Payer: Self-pay | Admitting: *Deleted

## 2019-01-06 NOTE — Telephone Encounter (Signed)
Spoke to pt and husband. He will come get a cup and wipes this afternoon.

## 2019-01-06 NOTE — Telephone Encounter (Signed)
They would probably not be coming in for labs on the same day. Since I will be doing a visit---I am okay with having them drop off a fresh urine specimen (you should run the urinalysis)-----unless she just wants to do the visit in the office (like coming in the side door)

## 2019-01-06 NOTE — Telephone Encounter (Signed)
Patient called stating that she and her husband have a Virtual CPE scheduled tomorrow. Patient stated that she has been on two rounds of antibiotics for a UTI. Patient stated that she was feeling fine, but now is having some discomfort, urgency and frequency. Patient wants to know if her husband can pick up a urine cup today and bring it back tomorrow when they possibly come for lab work?

## 2019-01-07 ENCOUNTER — Encounter: Payer: Self-pay | Admitting: Internal Medicine

## 2019-01-07 ENCOUNTER — Ambulatory Visit (INDEPENDENT_AMBULATORY_CARE_PROVIDER_SITE_OTHER): Payer: Medicare HMO | Admitting: Internal Medicine

## 2019-01-07 ENCOUNTER — Other Ambulatory Visit: Payer: Self-pay | Admitting: Internal Medicine

## 2019-01-07 VITALS — Ht 65.0 in | Wt 150.0 lb

## 2019-01-07 DIAGNOSIS — N390 Urinary tract infection, site not specified: Secondary | ICD-10-CM

## 2019-01-07 DIAGNOSIS — F39 Unspecified mood [affective] disorder: Secondary | ICD-10-CM

## 2019-01-07 DIAGNOSIS — I471 Supraventricular tachycardia: Secondary | ICD-10-CM | POA: Diagnosis not present

## 2019-01-07 DIAGNOSIS — K219 Gastro-esophageal reflux disease without esophagitis: Secondary | ICD-10-CM

## 2019-01-07 DIAGNOSIS — Z7189 Other specified counseling: Secondary | ICD-10-CM

## 2019-01-07 DIAGNOSIS — Z0001 Encounter for general adult medical examination with abnormal findings: Secondary | ICD-10-CM

## 2019-01-07 DIAGNOSIS — R300 Dysuria: Secondary | ICD-10-CM

## 2019-01-07 DIAGNOSIS — Z0000 Encounter for general adult medical examination without abnormal findings: Secondary | ICD-10-CM

## 2019-01-07 DIAGNOSIS — R69 Illness, unspecified: Secondary | ICD-10-CM | POA: Diagnosis not present

## 2019-01-07 LAB — POC URINALSYSI DIPSTICK (AUTOMATED)
Bilirubin, UA: NEGATIVE
Glucose, UA: NEGATIVE
Ketones, UA: NEGATIVE
Nitrite, UA: POSITIVE
Protein, UA: NEGATIVE
Spec Grav, UA: 1.02 (ref 1.010–1.025)
Urobilinogen, UA: 0.2 E.U./dL
pH, UA: 6 (ref 5.0–8.0)

## 2019-01-07 MED ORDER — TETANUS-DIPHTHERIA TOXOIDS TD 5-2 LFU IM INJ: 0.5000 mL | INJECTION | Freq: Once | INTRAMUSCULAR | 0 refills | Status: AC

## 2019-01-07 NOTE — Assessment & Plan Note (Signed)
Has done better Only takes the PPI in spells if her symptoms come back (not often)

## 2019-01-07 NOTE — Assessment & Plan Note (Signed)
No recent recurrence on the beta blocker

## 2019-01-07 NOTE — Assessment & Plan Note (Signed)
Lifelong mild anxiety Just uses the xanax prn (and not very often)

## 2019-01-07 NOTE — Progress Notes (Signed)
Hearing Screening Comments: Has not been checked in the last 12 months Vision Screening Comments: June 2019

## 2019-01-07 NOTE — Assessment & Plan Note (Signed)
See social history 

## 2019-01-07 NOTE — Progress Notes (Signed)
Subjective:    Patient ID: Denise Rhodes, female    DOB: 1940-11-24, 78 y.o.   MRN: [id number]  HPI Virtual visit for Medicare wellness and follow up of chronic health conditions Identification done Reviewed billing and she gave consent She is at home with her husband----I am in my office  Ongoing issues with recurrent urinary symptoms No fevers Has mild symptoms for a few days---but better in the past couple of days Using cranberry capsules daily--may be helping Is using the estrogen cream twice a week  Has had a little swelling in her feet Does resolve by the morning  Worse in summer No pain  No palpitations Continues on the beta blocker No chest pain No SOB No dizziness or syncope  Has stopped the PPI Uses it episodically---like for a 2 week spell if she acts up No dysphagia  Some anxiety still Lifelong but never severe Uses xanax prn---shares with huisband  No hospitalizations or surgery in the past year No tobacco or alcohol Other doctors---- Dr Gollan--cardiology, Dr Visco/Kowaski--urogynecology, Dr Olivia Mackie, Dr Claudius Sis, Dr Audie Box Vision is fine Mild hearing issues--not ready for aides No falls No depression or anhedonia Independent with instrumental ADLs Mild memory issues---no functional problems  Current Outpatient Medications on File Prior to Visit  Medication Sig Dispense Refill  . ALPRAZolam (XANAX) 0.25 MG tablet Take 1 tablet (0.25 mg total) by mouth 3 (three) times daily as needed. 30 tablet 0  . estradiol (ESTRACE) 0.1 MG/GM vaginal cream Place 1 Applicatorful vaginally 3 (three) times a week. (Patient taking differently: Place 1 Applicatorful vaginally as needed. ) 42.5 g 12  . Magnesium 250 MG TABS Take by mouth as needed.     . metoprolol succinate (TOPROL XL) 25 MG 24 hr tablet Take 1 tablet (25 mg total) by mouth daily. 90 tablet 4  . acyclovir ointment (ZOVIRAX) 5 % APPLY TOPICALLY 6 TIMES DAILY FOR 7 DAYS. (Patient  not taking: Reported on 01/07/2019) 5 g 3   No current facility-administered medications on file prior to visit.     No Known Allergies  Past Medical History:  Diagnosis Date  . Acute upper respiratory infections of unspecified site   . Anxiety state, unspecified   . Esophagitis 7/15   and gastritis on EGD-- Dr Gustavo Lah  . Gilbert's syndrome   . H/O: pneumonia 09/2006  . IBS (irritable bowel syndrome)   . Mitral valve disorders(424.0)   . Osteoporosis, unspecified   . Urinary tract infection, site not specified     Past Surgical History:  Procedure Laterality Date  . Bladder tack    . CATARACT EXTRACTION, BILATERAL  9/12, 1/13  . TOTAL ABDOMINAL HYSTERECTOMY W/ BILATERAL SALPINGOOPHORECTOMY  2000  . Vaginectomy colpocleisis cysto  2/16    Family History  Problem Relation Age of Onset  . Cancer Father        Lung  . Cancer Mother        Colon  . Hypertension Mother   . Diabetes Unknown        Paternal side  . Coronary artery disease Maternal Grandmother   . Coronary artery disease Paternal Grandmother     Social History   Socioeconomic History  . Marital status: Married    Spouse name: Not on file  . Number of children: 3  . Years of education: Not on file  . Highest education level: Not on file  Occupational History  . 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  Financial resource strain: Not on file  . Food insecurity:    Worry: Not on file    Inability: Not on file  . Transportation needs:    Medical: Not on file    Non-medical: Not on file  Tobacco Use  . Smoking status: Never Smoker  . Smokeless tobacco: Never Used  Substance and Sexual Activity  . Alcohol use: No    Alcohol/week: 0.0 standard drinks    Comment: Rare  . Drug use: No  . Sexual activity: Not on file  Lifestyle  . Physical activity:    Days per week: Not on file    Minutes per session: Not on file  . Stress: Not on file  Relationships  . Social  connections:    Talks on phone: Not on file    Gets together: Not on file    Attends religious service: Not on file    Active member of club or organization: Not on file    Attends meetings of clubs or organizations: Not on file    Relationship status: Not on file  . Intimate partner violence:    Fear of current or ex partner: Not on file    Emotionally abused: Not on file    Physically abused: Not on file    Forced sexual activity: Not on file  Other Topics Concern  . Not on file  Social History Narrative   Has living will    Husband is health care POA--- alternate is children    Would accept resuscitation attempts.   Would not want tube feeds if cognitively unaware   Review of Systems No headaches Appetite is fine Weight stable  Sleeps well Wears seat belt Bowels move better with the magnesium--no blood No sig back or joint pain No suspicious skin lesions    Objective:   Physical Exam  Constitutional: She is oriented to person, place, and time. She appears well-developed. No distress.  Respiratory: Effort normal. No respiratory distress.  Neurological: She is alert and oriented to person, place, and time.  President-- "Trump, Obama, Bush" 100- doesn't do numbers [id number] Recall 3/3  Psychiatric: She has a normal mood and affect. Her behavior is normal.           Assessment & Plan:

## 2019-01-07 NOTE — Assessment & Plan Note (Signed)
Has had some symptoms again--but seem better now Does have some pyuria--but feels okay, so will hold off on antibiotics now Last culture was 50K of ESBL E coli---so want to avoid unnecessary antibiotics If she has persistent symptoms, would try empiric 3 days of bactrim Continue the cranberry and estrogen cream

## 2019-01-07 NOTE — Assessment & Plan Note (Signed)
I have personally reviewed the Medicare Annual Wellness questionnaire and have noted 1. The patient's medical and social history 2. Their use of alcohol, tobacco or illicit drugs 3. Their current medications and supplements 4. The patient's functional ability including ADL's, fall risks, home safety risks and hearing or visual             impairment. 5. Diet and physical activities 6. Evidence for depression or mood disorders  The patients weight, height, BMI and visual acuity have been recorded in the chart I have made referrals, counseling and provided education to the patient based review of the above and I have provided the pt with a written personalized care plan for preventive services.  I have provided you with a copy of your personalized plan for preventive services. Please take the time to review along with your updated medication list.  Last colon 2015 Will do mammogram next April and consider a last one at age 73 Discussed exercise Yearly flu vaccine Will send Rx for Td to the pharmacy again

## 2019-01-08 NOTE — Telephone Encounter (Signed)
Based on yesterday's note, I was not sure if you wanted her to fill this yet.

## 2019-01-08 NOTE — Telephone Encounter (Signed)
No ---she said she would let me know if her symptoms recurred. Just cancel

## 2019-01-12 ENCOUNTER — Other Ambulatory Visit (INDEPENDENT_AMBULATORY_CARE_PROVIDER_SITE_OTHER): Payer: Medicare HMO

## 2019-01-12 DIAGNOSIS — I471 Supraventricular tachycardia: Secondary | ICD-10-CM

## 2019-01-12 LAB — COMPREHENSIVE METABOLIC PANEL
ALT: 11 U/L (ref 0–35)
AST: 16 U/L (ref 0–37)
Albumin: 3.8 g/dL (ref 3.5–5.2)
Alkaline Phosphatase: 79 U/L (ref 39–117)
BUN: 11 mg/dL (ref 6–23)
CO2: 28 mEq/L (ref 19–32)
Calcium: 8.9 mg/dL (ref 8.4–10.5)
Chloride: 106 mEq/L (ref 96–112)
Creatinine, Ser: 0.67 mg/dL (ref 0.40–1.20)
GFR: 85.14 mL/min (ref >60.00)
Glucose, Bld: 86 mg/dL (ref 70–99)
Potassium: 4 mEq/L (ref 3.5–5.1)
Sodium: 141 mEq/L (ref 135–145)
Total Bilirubin: 0.8 mg/dL (ref 0.2–1.2)
Total Protein: 6.3 g/dL (ref 6.0–8.3)

## 2019-01-12 LAB — CBC
HCT: 38.8 % (ref 36.0–46.0)
Hemoglobin: 12.8 g/dL (ref 12.0–15.0)
MCHC: 32.9 g/dL (ref 30.0–36.0)
MCV: 89.2 fl (ref 78.0–100.0)
Platelets: 203 10*3/uL (ref 150.0–400.0)
RBC: 4.36 Mil/uL (ref 3.87–5.11)
RDW: 14.3 % (ref 11.5–15.5)
WBC: 6.2 10*3/uL (ref 4.0–10.5)

## 2019-01-14 ENCOUNTER — Telehealth: Payer: Self-pay | Admitting: *Deleted

## 2019-01-14 ENCOUNTER — Other Ambulatory Visit: Payer: Self-pay | Admitting: Cardiovascular Disease

## 2019-01-14 NOTE — Telephone Encounter (Signed)
Patient called stating that she recently had her wellness visit. Patient stated that when she did her visit Dr. Silvio Pate told her that if she continued to have urinary problems he would prescribe for her 3 days of antibiotics.  Patient stated that she is having discomfort, frequency and urgency. Patient requested that an antibiotic be sent to [facility]

## 2019-01-15 MED ORDER — SULFAMETHOXAZOLE-TRIMETHOPRIM 800-160 MG PO TABS
1.0000 | ORAL_TABLET | Freq: Two times a day (BID) | ORAL | 1 refills | Status: DC
Start: 2019-01-15 — End: 2019-02-24

## 2019-01-15 NOTE — Telephone Encounter (Signed)
Spoke to pt

## 2019-01-15 NOTE — Telephone Encounter (Signed)
Please let her know that I sent the prescription 

## 2019-02-20 NOTE — Progress Notes (Signed)
Cardiology Office Note  Date:  02/24/2019   ID:  Denise Rhodes, DOB [birth year], MRN [id number]  PCP:  Venia Carbon, MD   Chief Complaint  Patient presents with  . Other    12 month follow up. Patient denies chest pain and SOB. Meds reviewed verbally with patient.     HPI:  Denise Rhodes is a 78 y.o. female w/ PMHx  anxiety, Insomnia IBS  palpitations 48 hour Holter monitor:  runs of atrial tachycardia, frequent APCs, bigeminal pattern. possible mitral valve prolapse She presents today for follow-up of her palpitations  BP range:  140 or above initial check at home seems to drop lower on follow-up check Did not bring her blood pressure measurements from home  No regular exercise program, Previously like to go shopping but has not been going outside the house very much  Continues on metoprolol daily Denies any palpitations or tachycardia  feels much better on metoprolol  Denies any leg weakness Although she does report she is " starting to feel her age"  Periodically having issues with anxiety Used to wake up in the middle of the night symptoms relieved with Xanax in the past Takes Xanax approximately twice per month  Prior total cholesterol 160, not checked recently  EKG  shows normal sinus rhythm with rate 72 bpm no significant ST or T-wave changes  Other past medical history Prior monitor showed runs of atrial tachycardia, frequent APCs, sometimes in a bigeminal pattern.  Previous echo indicated EF 27-25%, grade 1 diastolic dysfunction, structurally normal MV with normal leaflet separation, trivial MR.   PMH:   has a past medical history of Acute upper respiratory infections of unspecified site, Anxiety state, unspecified, Esophagitis (7/15), Gilbert's syndrome, H/O: pneumonia (09/2006), IBS (irritable bowel syndrome), Mitral valve disorders(424.0), Osteoporosis, unspecified, and Urinary tract infection, site not specified.  PSH:    Past Surgical History:   Procedure Laterality Date  . Bladder tack    . CATARACT EXTRACTION, BILATERAL  9/12, 1/13  . TOTAL ABDOMINAL HYSTERECTOMY W/ BILATERAL SALPINGOOPHORECTOMY  2000  . Vaginectomy colpocleisis cysto  2/16    Current Outpatient Medications  Medication Sig Dispense Refill  . ALPRAZolam (XANAX) 0.25 MG tablet Take 1 tablet (0.25 mg total) by mouth 3 (three) times daily as needed. 30 tablet 0  . estradiol (ESTRACE) 0.1 MG/GM vaginal cream Place 1 Applicatorful vaginally 3 (three) times a week. (Patient taking differently: Place 1 Applicatorful vaginally as needed. ) 42.5 g 12  . Magnesium 250 MG TABS Take by mouth as needed.     . metoprolol succinate (TOPROL-XL) 25 MG 24 hr tablet Take 1 tablet by mouth once daily 90 tablet 0   No current facility-administered medications for this visit.     Allergies:   Patient has no known allergies.   Social History:  The patient  reports that she has never smoked. She has never used smokeless tobacco. She reports that she does not drink alcohol or use drugs.   Family History:   family history includes Cancer in her father and mother; Coronary artery disease in her maternal grandmother and paternal grandmother; Diabetes in her unknown relative; Hypertension in her mother.    Review of Systems: Review of Systems  Constitutional: Negative.   Respiratory: Negative.   Cardiovascular: Negative.   Gastrointestinal: Negative.   Musculoskeletal: Negative.   Neurological: Negative.   Psychiatric/Behavioral: Negative.   All other systems reviewed and are negative.    PHYSICAL EXAM: VS:  BP (!) 158/92 (  BP Location: Left Arm, Patient Position: Sitting, Cuff Size: Normal)   Pulse 72   Ht 5\' 5"  (1.651 m)   Wt 153 lb (69.4 kg)   BMI 25.46 kg/m  , BMI Body mass index is 25.46 kg/m. Constitutional:  oriented to person, place, and time. No distress.  HENT:  Head: Normocephalic and atraumatic.  Eyes:  no discharge. No scleral icterus.  Neck: Normal range  of motion. Neck supple. No JVD present.  Cardiovascular: Normal rate, regular rhythm, normal heart sounds and intact distal pulses. Exam reveals no gallop and no friction rub. No edema No murmur heard. Pulmonary/Chest: Effort normal and breath sounds normal. No stridor. No respiratory distress.  no wheezes.  no rales.  no tenderness.  Abdominal: Soft.  no distension.  no tenderness.  Musculoskeletal: Normal range of motion.  no  tenderness or deformity.  Neurological:  normal muscle tone. Coordination normal. No atrophy Skin: Skin is warm and dry. No rash noted. not diaphoretic.  Psychiatric:  normal mood and affect. behavior is normal. Thought content normal.    Recent Labs: 01/12/2019: ALT 11; BUN 11; Creatinine, Ser 0.67; Hemoglobin 12.8; Platelets 203.0; Potassium 4.0; Sodium 141    Lipid Panel Lab Results  Component Value Date   CHOL 164 11/16/2014   HDL 61.40 11/16/2014   LDLCALC 88 11/16/2014   TRIG 71.0 11/16/2014      Wt Readings from Last 3 Encounters:  02/24/19 153 lb (69.4 kg)  01/07/19 150 lb (68 kg)  03/17/18 151 lb 12.8 oz (68.9 kg)       ASSESSMENT AND PLAN:  Atrial tachycardia (HCC) - Plan: EKG 12-Lead Doing well on metoprolol succinate 25 daily Denies any breakthrough tachycardia or palpitations No change to medications  Anxiety Takes Xanax twice a months overall feels stable Recommended a walking program  Essential hypertension Elevated numbers on today's visit likely secondary to anxiety They have a list of blood pressure measurements at home Recommend they send this into our office for review  Disposition:   F/U  12 months as needed    Total encounter time more than 25 minutes  Greater than 50% was spent in counseling and coordination of care with the patient    Orders Placed This Encounter  Procedures  . EKG 12-Lead     Signed, Esmond Plants, M.D., Ph.D. 02/24/2019  [place], [place]

## 2019-02-23 DIAGNOSIS — R300 Dysuria: Secondary | ICD-10-CM | POA: Diagnosis not present

## 2019-02-24 ENCOUNTER — Other Ambulatory Visit: Payer: Self-pay

## 2019-02-24 ENCOUNTER — Encounter: Payer: Self-pay | Admitting: Cardiovascular Disease

## 2019-02-24 ENCOUNTER — Ambulatory Visit (INDEPENDENT_AMBULATORY_CARE_PROVIDER_SITE_OTHER): Payer: Medicare HMO | Admitting: Cardiovascular Disease

## 2019-02-24 VITALS — BP 158/92 | HR 72 | Ht 65.0 in | Wt 153.0 lb

## 2019-02-24 DIAGNOSIS — I059 Rheumatic mitral valve disease, unspecified: Secondary | ICD-10-CM | POA: Diagnosis not present

## 2019-02-24 DIAGNOSIS — I471 Supraventricular tachycardia: Secondary | ICD-10-CM | POA: Diagnosis not present

## 2019-02-24 DIAGNOSIS — R002 Palpitations: Secondary | ICD-10-CM | POA: Diagnosis not present

## 2019-02-24 NOTE — Patient Instructions (Addendum)
Please monitor Blood pressure Goal <140 on the top number <90 on the bottom  Medication Instructions:  No changes  If you need a refill on your cardiac medications before your next appointment, please call your pharmacy.    Lab work: No new labs needed   If you have labs (blood work) drawn today and your tests are completely normal, you will receive your results only by: Marland Kitchen MyChart Message (if you have MyChart) OR . A paper copy in the mail If you have any lab test that is abnormal or we need to change your treatment, we will call you to review the results.   Testing/Procedures: No new testing needed   Follow-Up: At [facility], you and your health needs are our priority.  As part of our continuing mission to provide you with exceptional heart care, we have created designated Provider Care Teams.  These Care Teams include your primary Cardiologist (physician) and Advanced Practice Providers (APPs -  Physician Assistants and Nurse Practitioners) who all work together to provide you with the care you need, when you need it.  . You will need a follow up appointment in 12 months .   Please call our office 2 months in advance to schedule this appointment.    . Providers on your designated Care Team:   . Murray Hodgkins, NP . Christell Faith, PA-C . Marrianne Mood, PA-C  Any Other Special Instructions Will Be Listed Below (If Applicable).  For educational health videos Log in to : [email or web address] Or : [email or web address], password : [email or web address]   How to Take Your Blood Pressure You can take your blood pressure at home with a machine. You may need to check your blood pressure at home:  To check if you have high blood pressure (hypertension).  To check your blood pressure over time.  To make sure your blood pressure medicine is working. Supplies needed: You will need a blood pressure machine, or monitor. You can buy one at a drugstore or online. When choosing one:  Choose one with  an arm cuff.  Choose one that wraps around your upper arm. Only one finger should fit between your arm and the cuff.  Do not choose one that measures your blood pressure from your wrist or finger. Your doctor can suggest a monitor. How to prepare Avoid these things for 30 minutes before checking your blood pressure:  Drinking caffeine.  Drinking alcohol.  Eating.  Smoking.  Exercising. Five minutes before checking your blood pressure:  Pee.  Sit in a dining chair. Avoid sitting in a soft couch or armchair.  Be quiet. Do not talk. How to take your blood pressure Follow the instructions that came with your machine. If you have a digital blood pressure monitor, these may be the instructions: 1. Sit up straight. 2. Place your feet on the floor. Do not cross your ankles or legs. 3. Rest your left arm at the level of your heart. You may rest it on a table, desk, or chair. 4. Pull up your shirt sleeve. 5. Wrap the blood pressure cuff around the upper part of your left arm. The cuff should be 1 inch (2.5 cm) above your elbow. It is best to wrap the cuff around bare skin. 6. Fit the cuff snugly around your arm. You should be able to place only one finger between the cuff and your arm. 7. Put the cord inside the groove of your elbow. 8. Press the power button. 9. Sit quietly while the  cuff fills with air and loses air. 10. Write down the numbers on the screen. 11. Wait 2-3 minutes and then repeat steps 1-10. What do the numbers mean? Two numbers make up your blood pressure. The first number is called systolic pressure. The second is called diastolic pressure. An example of a blood pressure reading is "120 over 80" (or 120/80). If you are an adult and do not have a medical condition, use this guide to find out if your blood pressure is normal: Normal  First number: below 120.  Second number: below 80. Elevated  First number: 120-129.  Second number: below 80. Hypertension  stage 1  First number: 130-139.  Second number: 80-89. Hypertension stage 2  First number: 140 or above.  Second number: 18 or above. Your blood pressure is above normal even if only the top or bottom number is above normal. Follow these instructions at home:  Check your blood pressure as often as your doctor tells you to.  Take your monitor to your next doctor's appointment. Your doctor will: ? Make sure you are using it correctly. ? Make sure it is working right.  Make sure you understand what your blood pressure numbers should be.  Tell your doctor if your medicines are causing side effects. Contact a doctor if:  Your blood pressure keeps being high. Get help right away if:  Your first blood pressure number is higher than 180.  Your second blood pressure number is higher than 120. This information is not intended to replace advice given to you by your health care provider. Make sure you discuss any questions you have with your health care provider. Document Released: 07/05/2008 Document Revised: 07/05/2017 Document Reviewed: 12/30/2015 Elsevier Patient Education  2020 [employer].  Blood Pressure Record Sheet To take your blood pressure, you will need a blood pressure machine. You can buy a blood pressure machine (blood pressure monitor) at your clinic, drug store, or online. When choosing one, consider:  An automatic monitor that has an arm cuff.  A cuff that wraps snugly around your upper arm. You should be able to fit only one finger between your arm and the cuff.  A device that stores blood pressure reading results.  Do not choose a monitor that measures your blood pressure from your wrist or finger. Follow your health care provider's instructions for how to take your blood pressure. To use this form:  Get one reading in the morning (a.m.) before you take any medicines.  Get one reading in the evening (p.m.) before supper.  Take at least 2 readings with each  blood pressure check. This makes sure the results are correct. Wait 1-2 minutes between measurements.  Write down the results in the spaces on this form.  Repeat this once a week, or as told by your health care provider.  Make a follow-up appointment with your health care provider to discuss the results. Blood pressure log Date: _______________________  a.m. _____________________(1st reading) _____________________(2nd reading)  p.m. _____________________(1st reading) _____________________(2nd reading) Date: _______________________  a.m. _____________________(1st reading) _____________________(2nd reading)  p.m. _____________________(1st reading) _____________________(2nd reading) Date: _______________________  a.m. _____________________(1st reading) _____________________(2nd reading)  p.m. _____________________(1st reading) _____________________(2nd reading) Date: _______________________  a.m. _____________________(1st reading) _____________________(2nd reading)  p.m. _____________________(1st reading) _____________________(2nd reading) Date: _______________________  a.m. _____________________(1st reading) _____________________(2nd reading)  p.m. _____________________(1st reading) _____________________(2nd reading) This information is not intended to replace advice given to you by your health care provider. Make sure you discuss any questions you have with your health  care provider. Document Released: 04/21/2003 Document Revised: 09/20/2017 Document Reviewed: 07/23/2017 Elsevier Patient Education  2020 [employer].

## 2019-03-10 ENCOUNTER — Other Ambulatory Visit: Payer: Self-pay

## 2019-03-10 ENCOUNTER — Telehealth: Payer: Self-pay | Admitting: *Deleted

## 2019-03-10 ENCOUNTER — Ambulatory Visit (INDEPENDENT_AMBULATORY_CARE_PROVIDER_SITE_OTHER): Payer: Medicare HMO | Admitting: Internal Medicine

## 2019-03-10 ENCOUNTER — Encounter: Payer: Self-pay | Admitting: Internal Medicine

## 2019-03-10 VITALS — BP 142/84 | HR 75 | Temp 98.0°F | Wt 155.0 lb

## 2019-03-10 DIAGNOSIS — R350 Frequency of micturition: Secondary | ICD-10-CM | POA: Diagnosis not present

## 2019-03-10 DIAGNOSIS — N390 Urinary tract infection, site not specified: Secondary | ICD-10-CM

## 2019-03-10 DIAGNOSIS — R3915 Urgency of urination: Secondary | ICD-10-CM

## 2019-03-10 DIAGNOSIS — R300 Dysuria: Secondary | ICD-10-CM

## 2019-03-10 DIAGNOSIS — R3989 Other symptoms and signs involving the genitourinary system: Secondary | ICD-10-CM | POA: Diagnosis not present

## 2019-03-10 LAB — POC URINALSYSI DIPSTICK (AUTOMATED)
Bilirubin, UA: NEGATIVE
Glucose, UA: NEGATIVE
Ketones, UA: NEGATIVE
Nitrite, UA: NEGATIVE
Protein, UA: NEGATIVE
Spec Grav, UA: 1.01 (ref 1.010–1.025)
Urobilinogen, UA: 0.2 E.U./dL
pH, UA: 6 (ref 5.0–8.0)

## 2019-03-10 NOTE — Progress Notes (Signed)
HPI  Pt presents to the clinic today with c/o urinary urgency, frequency and dysuria. This started approximately 2 weeks ago . She was last treated for a UTI 02/23/2019 She was recently treated with Keflex.  Per previous culture, bacteria was resistant to that so Septra was called in for patient.  However, patient did not get that message, thus did not take an antibiotic that would treat her existing UTI. She has not tried anything OTC.    Review of Systems  Past Medical History:  Diagnosis Date  . Acute upper respiratory infections of unspecified site   . Anxiety state, unspecified   . Esophagitis 7/15   and gastritis on EGD-- Dr Gustavo Lah  . Gilbert's syndrome   . H/O: pneumonia 09/2006  . IBS (irritable bowel syndrome)   . Mitral valve disorders(424.0)   . Osteoporosis, unspecified   . Urinary tract infection, site not specified     Family History  Problem Relation Age of Onset  . Cancer Father        Lung  . Cancer Mother        Colon  . Hypertension Mother   . Diabetes Unknown        Paternal side  . Coronary artery disease Maternal Grandmother   . Coronary artery disease Paternal Grandmother     Social History   Socioeconomic History  . Marital status: Married    Spouse name: Not on file  . Number of children: 3  . Years of education: Not on file  . Highest education level: Not on file  Occupational History  . 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 Financial resource strain: Not on file  . Food insecurity    Worry: Not on file    Inability: Not on file  . Transportation needs    Medical: Not on file    Non-medical: Not on file  Tobacco Use  . Smoking status: Never Smoker  . Smokeless tobacco: Never Used  Substance and Sexual Activity  . Alcohol use: No    Alcohol/week: 0.0 standard drinks    Comment: Rare  . Drug use: No  . Sexual activity: Not on file  Lifestyle  . Physical activity    Days per week: Not on file   Minutes per session: Not on file  . Stress: Not on file  Relationships  . Social Herbalist on phone: Not on file    Gets together: Not on file    Attends religious service: Not on file    Active member of club or organization: Not on file    Attends meetings of clubs or organizations: Not on file    Relationship status: Not on file  . Intimate partner violence    Fear of current or ex partner: Not on file    Emotionally abused: Not on file    Physically abused: Not on file    Forced sexual activity: Not on file  Other Topics Concern  . Not on file  Social History Narrative   Has living will    Husband is health care POA--- alternate is children    Would accept resuscitation attempts.   Would not want tube feeds if cognitively unaware    No Known Allergies   Constitutional: Denies fever, malaise, fatigue, headache or abrupt weight changes.   GU: Pt reports urgency, frequency and pain with urination. Denies burning sensation, blood in urine, odor or discharge. Skin: Denies redness, rashes, lesions  or ulcercations.   No other specific complaints in a complete review of systems (except as listed in HPI above).    Objective:   Physical Exam  BP (!) 142/84   Pulse 75   Temp 98 F (36.7 C) (Temporal)   Wt 155 lb (70.3 kg)   SpO2 98%   BMI 25.79 kg/m    Wt Readings from Last 3 Encounters:  02/24/19 153 lb (69.4 kg)  01/07/19 150 lb (68 kg)  03/17/18 151 lb 12.8 oz (68.9 kg)    General: Appears her stated age, well developed, well nourished in NAD. Cardiovascular: Normal rate and rhythm. S1,S2 noted.   Pulmonary/Chest: Normal effort and positive vesicular breath sounds. No respiratory distress. No wheezes, rales or ronchi noted.  Abdomen: Soft. Normal bowel sounds. No distention or masses noted.  Tender to palpation over the bladder area. No CVA tenderness.        Assessment & Plan:   Urgency, Frequency, Dysuria secondary to Recurrent UTI:  Urinalysis:  3+ leuks, 1+ blood Will send urine culture Advised patient to pick up current Septra prescription that was sent previously. OK to take AZO OTC Drink plenty of fluids  RTC as needed or if symptoms persist. Webb Silversmith, NP

## 2019-03-10 NOTE — Addendum Note (Signed)
Addended by: Lurlean Nanny on: 03/10/2019 04:34 PM   Modules accepted: Orders

## 2019-03-10 NOTE — Patient Instructions (Signed)
Acute Urinary Retention, Female  Acute urinary retention means that you cannot pee (urinate) at all, or that you pee too little and your bladder is not emptied completely. If it is not treated, it can lead to kidney damage or other serious problems. Follow these instructions at home:  Take over-the-counter and prescription medicines only as told by your doctor. Ask your doctor what medicines you should stay away from. Do not take any medicine unless your doctor says it is okay to do so.  If you were sent home with a tube that drains pee from the bladder (catheter), take care of it as told by your doctor.  Drink enough fluid to keep your pee clear or pale yellow.  If you were given an antibiotic, take it as told by your doctor. Do not stop taking the antibiotic even if you start to feel better.  Do not use any products that contain nicotine or tobacco, such as cigarettes and e-cigarettes. If you need help quitting, ask your doctor.  Watch for changes in your symptoms. Tell your doctor about them.  If told, keep track of any changes in your blood pressure at home. Tell your doctor about them.  Keep all follow-up visits as told by your doctor. This is important. Contact a doctor if:  You have spasms or you leak pee when you have spasms. Get help right away if:  You have chills or a fever.  You have blood in your pee.  You have a tube that drains the bladder and: ? The tube stops draining pee. ? The tube falls out. Summary  Acute urinary retention means that you cannot pee at all, or that you pee too little and your bladder is not emptied completely. If it is not treated, it can result in kidney damage or other serious problems.  If you were sent home with a tube that drains pee from the bladder, take care of it as told by your doctor.  Pay attention to any changes in your symptoms. Tell your doctor about them. This information is not intended to replace advice given to you by your  health care provider. Make sure you discuss any questions you have with your health care provider. Document Released: 01/09/2008 Document Revised: 07/05/2017 Document Reviewed: 08/24/2016 Elsevier Patient Education  2020 Elsevier Inc[employer]  

## 2019-03-10 NOTE — Telephone Encounter (Signed)
Noted, will see at upcoming appt

## 2019-03-10 NOTE — Telephone Encounter (Signed)
Patient called stating that she would just like to drop off a urine sample to see if she still has a UTI. Patient stated that she has ongoing UTI's and has been treated recently by Dr. Silvio Pate and her doctor at [facility]. Patient stated that she is getting ready to go out of town. Advised patient that she really needs to have an office visit and urine checked. Patient agreed to an appointment today. Patient stated that she has a history of bladder surgery at [facility], but that is a long way to drive. Appointment scheduled with Webb Silversmith NP today at 2:00.

## 2019-03-11 ENCOUNTER — Telehealth: Payer: Self-pay | Admitting: *Deleted

## 2019-03-11 NOTE — Telephone Encounter (Signed)
noted 

## 2019-03-11 NOTE — Telephone Encounter (Signed)
Patient's husband called stating that they are leaving to go out of town Friday morning. Mr. Wileman requested that if the results come in after they leave to call his cell number with the results and he can have any medication that is prescribed transferred to a pharmacy where they will be.

## 2019-03-12 LAB — URINE CULTURE
MICRO NUMBER:: 735049
SPECIMEN QUALITY:: ADEQUATE

## 2019-03-12 MED ORDER — CIPROFLOXACIN HCL 250 MG PO TABS: 250.0000 mg | ORAL_TABLET | Freq: Two times a day (BID) | ORAL | 0 refills | Status: DC

## 2019-03-12 NOTE — Addendum Note (Signed)
Addended by: Jearld Fenton on: 03/12/2019 12:33 PM   Modules accepted: Orders

## 2019-03-12 NOTE — Telephone Encounter (Signed)
See result note.  

## 2019-04-01 ENCOUNTER — Ambulatory Visit: Payer: Medicare HMO

## 2019-04-02 ENCOUNTER — Ambulatory Visit (INDEPENDENT_AMBULATORY_CARE_PROVIDER_SITE_OTHER): Payer: Medicare HMO

## 2019-04-02 DIAGNOSIS — Z23 Encounter for immunization: Secondary | ICD-10-CM | POA: Diagnosis not present

## 2019-04-03 ENCOUNTER — Other Ambulatory Visit: Payer: Self-pay | Admitting: Cardiovascular Disease

## 2019-04-08 ENCOUNTER — Encounter: Payer: Self-pay | Admitting: Family Medicine

## 2019-04-08 ENCOUNTER — Other Ambulatory Visit: Payer: Self-pay

## 2019-04-08 ENCOUNTER — Ambulatory Visit (INDEPENDENT_AMBULATORY_CARE_PROVIDER_SITE_OTHER): Payer: Medicare HMO | Admitting: Family Medicine

## 2019-04-08 VITALS — BP 142/86 | HR 79 | Temp 98.4°F | Ht 65.0 in | Wt 154.1 lb

## 2019-04-08 DIAGNOSIS — R8290 Unspecified abnormal findings in urine: Secondary | ICD-10-CM

## 2019-04-08 DIAGNOSIS — N390 Urinary tract infection, site not specified: Secondary | ICD-10-CM

## 2019-04-08 DIAGNOSIS — N3090 Cystitis, unspecified without hematuria: Secondary | ICD-10-CM | POA: Diagnosis not present

## 2019-04-08 DIAGNOSIS — R3915 Urgency of urination: Secondary | ICD-10-CM | POA: Diagnosis not present

## 2019-04-08 DIAGNOSIS — R350 Frequency of micturition: Secondary | ICD-10-CM | POA: Diagnosis not present

## 2019-04-08 LAB — POCT URINALYSIS DIPSTICK
Bilirubin, UA: NEGATIVE
Blood, UA: NEGATIVE
Glucose, UA: NEGATIVE
Ketones, UA: NEGATIVE
Nitrite, UA: NEGATIVE
Protein, UA: NEGATIVE
Spec Grav, UA: <=1.005 — AB (ref 1.010–1.025)
Urobilinogen, UA: 0.2 E.U./dL
pH, UA: 7.5 (ref 5.0–8.0)

## 2019-04-08 NOTE — Progress Notes (Signed)
Subjective:    Patient ID: Denise Rhodes, female    DOB: Dec 09, 1940, 78 y.o.   MRN: [id number]  HPI Chief Complaint  Patient presents with  . Urinary Tract Infection    Pt c/o discomfort/extreme irritation "enternally". Urinary Frequency and urgency.    Has had increased number of UTI x 1.5 years. Had bladder suspension 4 years ago. Currently using estrogen cream and had pelvic floor PT.  History of urinary incontinence, urge incontinence, stress incontinence, post void incontinence. No worse lately.  Increased frequency, nocturia. Long standing constipation, controlled by diet. Usually has bm most days, has small bms, sometimes feels that she evacuates completely. Used fiber in past, caused cramps, gas.  Has difficulty emptying bladder with infection symptoms. Poor fluid intake at baseline.  No fever, nausea/vomiting.   Past Medical History:  Diagnosis Date  . Acute upper respiratory infections of unspecified site   . Anxiety state, unspecified   . Esophagitis 7/15   and gastritis on EGD-- Dr Gustavo Lah  . Gilbert's syndrome   . H/O: pneumonia 09/2006  . IBS (irritable bowel syndrome)   . Mitral valve disorders(424.0)   . Osteoporosis, unspecified   . Urinary tract infection, site not specified    Past Surgical History:  Procedure Laterality Date  . Bladder tack    . CATARACT EXTRACTION, BILATERAL  9/12, 1/13  . TOTAL ABDOMINAL HYSTERECTOMY W/ BILATERAL SALPINGOOPHORECTOMY  2000  . Vaginectomy colpocleisis cysto  2/16   Family History  Problem Relation Age of Onset  . Cancer Father        Lung  . Cancer Mother        Colon  . Hypertension Mother   . Diabetes Unknown        Paternal side  . Coronary artery disease Maternal Grandmother   . Coronary artery disease Paternal Grandmother    Social History   Tobacco Use  . Smoking status: Never Smoker  . Smokeless tobacco: Never Used  Substance Use Topics  . Alcohol use: No    Alcohol/week: 0.0 standard drinks   Comment: Rare  . Drug use: No      Review of Systems Per HPI    Objective:   Physical Exam Vitals signs reviewed.  Constitutional:      General: She is not in acute distress.    Appearance: Normal appearance. She is normal weight. She is not ill-appearing, toxic-appearing or diaphoretic.  HENT:     Head: Normocephalic and atraumatic.  Eyes:     Conjunctiva/sclera: Conjunctivae normal.  Cardiovascular:     Rate and Rhythm: Normal rate.  Pulmonary:     Effort: Pulmonary effort is normal.  Neurological:     Mental Status: She is alert and oriented to person, place, and time.  Psychiatric:        Mood and Affect: Mood normal.        Behavior: Behavior normal.        Thought Content: Thought content normal.        Judgment: Judgment normal.      BP (!) 142/86 (BP Location: Left Arm, Patient Position: Sitting, Cuff Size: Normal)   Pulse 79   Temp 98.4 F (36.9 C) (Temporal)   Ht 5\' 5"  (1.651 m)   Wt 154 lb 1.9 oz (69.9 kg)   SpO2 98%   BMI 25.65 kg/m  Wt Readings from Last 3 Encounters:  04/08/19 154 lb 1.9 oz (69.9 kg)  03/10/19 155 lb (70.3 kg)  02/24/19 153 lb (  69.4 kg)   Results for orders placed or performed in visit on 04/08/19  Urinalysis Dipstick  Result Value Ref Range   Color, UA light yellow    Clarity, UA cloudy    Glucose, UA Negative Negative   Bilirubin, UA neg    Ketones, UA neg    Spec Grav, UA <=1.005 (A) 1.010 - 1.025   Blood, UA neg    pH, UA 7.5 5.0 - 8.0   Protein, UA Negative Negative   Urobilinogen, UA 0.2 0.2 or 1.0 E.U./dL   Nitrite, UA neg    Leukocytes, UA Large (3+) (A) Negative   Appearance     Odor          Assessment & Plan:  1. Urinary frequency - Urinalysis Dipstick - Urine Culture  2. Urgency of urination - Urinalysis Dipstick - Urine Culture  3. Abnormal urinalysis - Urine Culture  4. Frequent UTI - Given frequency and impact on quality of life, discussed having her see urology again, she is agreeable -  Ambulatory referral to Urology  5. Cystitis - discussed starting antibiotic right away vs waiting for culture. She elected to wait for culture results and she was instructed to notify office of any worsening symptoms, fever, nausea/vomiting or back pain   Clarene Reamer, FNP-BC  [facility] at [facility], [place]  04/09/2019 7:14 AM

## 2019-04-08 NOTE — Patient Instructions (Addendum)
Try Miralax once a day for 2 weeks  Increase fluids  I will notify you of culture results and treatment as soon as I get them back  You should get a call about urology appointment in next 5-10 days.

## 2019-04-09 ENCOUNTER — Encounter: Payer: Self-pay | Admitting: Family Medicine

## 2019-04-10 ENCOUNTER — Telehealth: Payer: Self-pay

## 2019-04-10 ENCOUNTER — Other Ambulatory Visit: Payer: Self-pay | Admitting: Family Medicine

## 2019-04-10 DIAGNOSIS — N3090 Cystitis, unspecified without hematuria: Secondary | ICD-10-CM

## 2019-04-10 LAB — URINE CULTURE
MICRO NUMBER:: 840037
SPECIMEN QUALITY:: ADEQUATE

## 2019-04-10 MED ORDER — SULFAMETHOXAZOLE-TRIMETHOPRIM 800-160 MG PO TABS: 1.0000 | ORAL_TABLET | Freq: Two times a day (BID) | ORAL | 0 refills | Status: DC

## 2019-04-10 NOTE — Telephone Encounter (Signed)
Results reviewed with patient, expressed understanding. Nothing further needed.

## 2019-04-10 NOTE — Telephone Encounter (Signed)
Please call patient and let her know that I just got her culture back. I have sent in an antibiotic. I recommend she have a follow up culture if persistent symptoms once antibiotic finished.

## 2019-04-10 NOTE — Telephone Encounter (Signed)
Pt's husband called to ask when the urine culture will be back. I told him it may be late tonight this evening or tomorrow before the results come in. He said she is very uncomfortable and not feeling well. Asking to be contacted ASAP to get an antibiotic.

## 2019-04-15 DIAGNOSIS — N3281 Overactive bladder: Secondary | ICD-10-CM | POA: Diagnosis not present

## 2019-04-15 DIAGNOSIS — N3941 Urge incontinence: Secondary | ICD-10-CM | POA: Diagnosis not present

## 2019-04-15 DIAGNOSIS — N3020 Other chronic cystitis without hematuria: Secondary | ICD-10-CM | POA: Diagnosis not present

## 2019-04-15 DIAGNOSIS — N393 Stress incontinence (female) (male): Secondary | ICD-10-CM | POA: Diagnosis not present

## 2019-05-12 DIAGNOSIS — R300 Dysuria: Secondary | ICD-10-CM | POA: Diagnosis not present

## 2019-05-12 DIAGNOSIS — N393 Stress incontinence (female) (male): Secondary | ICD-10-CM | POA: Diagnosis not present

## 2019-05-12 DIAGNOSIS — N3281 Overactive bladder: Secondary | ICD-10-CM | POA: Diagnosis not present

## 2019-05-12 DIAGNOSIS — N3020 Other chronic cystitis without hematuria: Secondary | ICD-10-CM | POA: Diagnosis not present

## 2019-05-12 DIAGNOSIS — N3941 Urge incontinence: Secondary | ICD-10-CM | POA: Diagnosis not present

## 2019-05-12 DIAGNOSIS — N399 Disorder of urinary system, unspecified: Secondary | ICD-10-CM | POA: Diagnosis not present

## 2019-05-18 ENCOUNTER — Ambulatory Visit: Payer: Self-pay | Admitting: Urology

## 2019-06-12 DIAGNOSIS — N3020 Other chronic cystitis without hematuria: Secondary | ICD-10-CM | POA: Diagnosis not present

## 2019-06-12 DIAGNOSIS — N3281 Overactive bladder: Secondary | ICD-10-CM | POA: Diagnosis not present

## 2019-07-04 ENCOUNTER — Other Ambulatory Visit: Payer: Self-pay | Admitting: Cardiovascular Disease

## 2019-07-13 ENCOUNTER — Encounter: Payer: Self-pay | Admitting: Internal Medicine

## 2019-07-13 ENCOUNTER — Ambulatory Visit (INDEPENDENT_AMBULATORY_CARE_PROVIDER_SITE_OTHER): Payer: Medicare HMO | Admitting: Internal Medicine

## 2019-07-13 ENCOUNTER — Other Ambulatory Visit: Payer: Self-pay

## 2019-07-13 VITALS — BP 136/90 | HR 82 | Temp 97.6°F | Ht 65.0 in | Wt 157.0 lb

## 2019-07-13 DIAGNOSIS — M25511 Pain in right shoulder: Secondary | ICD-10-CM | POA: Diagnosis not present

## 2019-07-13 DIAGNOSIS — M25519 Pain in unspecified shoulder: Secondary | ICD-10-CM | POA: Insufficient documentation

## 2019-07-13 DIAGNOSIS — N390 Urinary tract infection, site not specified: Secondary | ICD-10-CM

## 2019-07-13 DIAGNOSIS — M25512 Pain in left shoulder: Secondary | ICD-10-CM | POA: Diagnosis not present

## 2019-07-13 LAB — POC URINALSYSI DIPSTICK (AUTOMATED)
Bilirubin, UA: NEGATIVE
Glucose, UA: NEGATIVE
Ketones, UA: NEGATIVE
Nitrite, UA: NEGATIVE
Protein, UA: NEGATIVE
Spec Grav, UA: 1.01 (ref 1.010–1.025)
Urobilinogen, UA: 0.2 E.U./dL
pH, UA: 6.5 (ref 5.0–8.0)

## 2019-07-13 LAB — COMPREHENSIVE METABOLIC PANEL
ALT: 11 U/L (ref 0–35)
AST: 15 U/L (ref 0–37)
Albumin: 3.8 g/dL (ref 3.5–5.2)
Alkaline Phosphatase: 83 U/L (ref 39–117)
BUN: 16 mg/dL (ref 6–23)
CO2: 29 mEq/L (ref 19–32)
Calcium: 9.2 mg/dL (ref 8.4–10.5)
Chloride: 105 mEq/L (ref 96–112)
Creatinine, Ser: 0.68 mg/dL (ref 0.40–1.20)
GFR: 83.59 mL/min (ref >60.00)
Glucose, Bld: 99 mg/dL (ref 70–99)
Potassium: 4.2 mEq/L (ref 3.5–5.1)
Sodium: 140 mEq/L (ref 135–145)
Total Bilirubin: 0.9 mg/dL (ref 0.2–1.2)
Total Protein: 6.5 g/dL (ref 6.0–8.3)

## 2019-07-13 LAB — CBC
HCT: 39.4 % (ref 36.0–46.0)
Hemoglobin: 12.6 g/dL (ref 12.0–15.0)
MCHC: 32.1 g/dL (ref 30.0–36.0)
MCV: 88.8 fl (ref 78.0–100.0)
Platelets: 241 10*3/uL (ref 150.0–400.0)
RBC: 4.44 Mil/uL (ref 3.87–5.11)
RDW: 14.3 % (ref 11.5–15.5)
WBC: 7.8 10*3/uL (ref 4.0–10.5)

## 2019-07-13 LAB — SEDIMENTATION RATE: Sed Rate: 38 mm/hr — ABNORMAL HIGH (ref 0–30)

## 2019-07-13 MED ORDER — SULFAMETHOXAZOLE-TRIMETHOPRIM 800-160 MG PO TABS: 1.0000 | ORAL_TABLET | Freq: Two times a day (BID) | ORAL | 0 refills | Status: DC

## 2019-07-13 MED ORDER — TRIMETHOPRIM 100 MG PO TABS: 100.0000 mg | ORAL_TABLET | Freq: Every day | ORAL | 0 refills | Status: DC

## 2019-07-13 NOTE — Addendum Note (Signed)
Addended by: Pilar Grammes on: 07/13/2019 12:24 PM   Modules accepted: Orders

## 2019-07-13 NOTE — Progress Notes (Signed)
Subjective:    Patient ID: Denise Rhodes, female    DOB: [birth year], 78 y.o.   MRN: [id number]  HPI  Here with husband due to 3 months of "lack of mobility" Husband is here also  This visit occurred during the SARS-CoV-2 public health emergency.  Safety protocols were in place, including screening questions prior to the visit, additional usage of staff PPE, and extensive cleaning of exam room while observing appropriate contact time as indicated for disinfecting solutions.   Right shoulder pain mostly---now some on left Low back pain--makes it hard to walk Variable from day to day Ibuprofen 400mg  helps some Wonders if it could be related to the flu vaccine--seemed to come on after that  Some hip and leg issues (low back to hips)----mostly at night Hard to tell if joint or muscle  Recurrent UTIs also Seen here in September---then went to Dr Yves Dill High dose cranberry/estrogen cream Last antibiotic 2-3 weeks ago--does feel better when on this He relates some of this problem to chronic leakage and need for pad (she tries to change it as much as possible) Got mybetriq samples--but can't afford Rx (didn't take it)  Current Outpatient Medications on File Prior to Visit  Medication Sig Dispense Refill  . ALPRAZolam (XANAX) 0.25 MG tablet Take 1 tablet (0.25 mg total) by mouth 3 (three) times daily as needed. 30 tablet 0  . estradiol (ESTRACE) 0.1 MG/GM vaginal cream Place 1 Applicatorful vaginally 3 (three) times a week. (Patient taking differently: Place 1 Applicatorful vaginally as needed. ) 42.5 g 12  . Magnesium 250 MG TABS Take by mouth daily.     . metoprolol succinate (TOPROL-XL) 25 MG 24 hr tablet Take 1 tablet by mouth once daily 90 tablet 0   No current facility-administered medications on file prior to visit.     No Known Allergies  Past Medical History:  Diagnosis Date  . Acute upper respiratory infections of unspecified site   . Anxiety state, unspecified   .  Esophagitis 7/15   and gastritis on EGD-- Dr Gustavo Lah  . Gilbert's syndrome   . H/O: pneumonia 09/2006  . IBS (irritable bowel syndrome)   . Mitral valve disorders(424.0)   . Osteoporosis, unspecified   . Urinary tract infection, site not specified     Past Surgical History:  Procedure Laterality Date  . Bladder tack    . CATARACT EXTRACTION, BILATERAL  9/12, 1/13  . TOTAL ABDOMINAL HYSTERECTOMY W/ BILATERAL SALPINGOOPHORECTOMY  2000  . Vaginectomy colpocleisis cysto  2/16    Family History  Problem Relation Age of Onset  . Cancer Father        Lung  . Cancer Mother        Colon  . Hypertension Mother   . Diabetes Unknown        Paternal side  . Coronary artery disease Maternal Grandmother   . Coronary artery disease Paternal Grandmother     Social History   Socioeconomic History  . Marital status: Married    Spouse name: Not on file  . Number of children: 3  . Years of education: Not on file  . Highest education level: Not on file  Occupational History  . 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 Financial resource strain: Not on file  . Food insecurity    Worry: Not on file    Inability: Not on file  . Transportation needs    Medical: Not on file  Non-medical: Not on file  Tobacco Use  . Smoking status: Never Smoker  . Smokeless tobacco: Never Used  Substance and Sexual Activity  . Alcohol use: No    Alcohol/week: 0.0 standard drinks    Comment: Rare  . Drug use: No  . Sexual activity: Not on file  Lifestyle  . Physical activity    Days per week: Not on file    Minutes per session: Not on file  . Stress: Not on file  Relationships  . Social Herbalist on phone: Not on file    Gets together: Not on file    Attends religious service: Not on file    Active member of club or organization: Not on file    Attends meetings of clubs or organizations: Not on file    Relationship status: Not on file  . Intimate partner  violence    Fear of current or ex partner: Not on file    Emotionally abused: Not on file    Physically abused: Not on file    Forced sexual activity: Not on file  Other Topics Concern  . Not on file  Social History Narrative   Has living will    Husband is health care POA--- alternate is children    Would accept resuscitation attempts.   Would not want tube feeds if cognitively unaware   Review of Systems No fever Appetite okay Weight stable    Objective:   Physical Exam  Constitutional: She appears well-developed. No distress.  GI: Soft. There is no abdominal tenderness.  Musculoskeletal:        General: No edema.     Comments: No back tenderness External rotation restricted in left > right shoulder Hips mildly stiff in internal rotation but no pain  Neurological:  Mildly antalgic gait  No weakness           Assessment & Plan:

## 2019-07-13 NOTE — Assessment & Plan Note (Signed)
Ongoing symptoms with leukocytes today Will treat with 5 days of sepra Then 3 months of trimethoprim to see if we can quiet things down

## 2019-07-13 NOTE — Patient Instructions (Signed)
Take the 5 days of sulfamethoxazole/trimethoprim and then start the trimenthoprim alone. You can use 2 ibuprofen (200mg ) daily to help the pain.

## 2019-07-13 NOTE — Assessment & Plan Note (Signed)
Also low back/hip girdle pain No clearly arthritic ?reaction to flu vaccine? PMR?? Will check labs----prednisone if sed rate up Ibuprofen helps greatly--would stick with that and therapy program if blood work negative

## 2019-09-06 ENCOUNTER — Ambulatory Visit: Payer: Medicare HMO

## 2019-09-07 ENCOUNTER — Ambulatory Visit: Payer: Medicare HMO

## 2019-09-11 ENCOUNTER — Telehealth: Payer: Self-pay

## 2019-09-11 NOTE — Telephone Encounter (Signed)
Patient called stating that she is scheduled to get COVID vaccine on Monday 09/14/19. She has been experiencing rotator cuff issues/pain in her shoulders for a few weeks, which patient has seen Dr Silvio Pate for. Has been taking Ibuprofen. She wanted to make sure that this pain/issue she is having is not going to be impacted/made worse by the administration of this vaccine.

## 2019-09-11 NOTE — Telephone Encounter (Signed)
Spoke to pt. She appreciated the call.  

## 2019-09-11 NOTE — Telephone Encounter (Signed)
You can get shoulder pain after the vaccine but it should not directly affect her current problems. Despite these concerns, I still highly recommend that she get the vaccine

## 2019-09-13 ENCOUNTER — Ambulatory Visit: Payer: Medicare HMO

## 2019-09-14 ENCOUNTER — Other Ambulatory Visit: Payer: Self-pay

## 2019-09-14 ENCOUNTER — Ambulatory Visit: Payer: Medicare HMO | Attending: Internal Medicine

## 2019-09-14 DIAGNOSIS — Z23 Encounter for immunization: Secondary | ICD-10-CM | POA: Insufficient documentation

## 2019-09-14 NOTE — Progress Notes (Signed)
   Covid-19 Vaccination Clinic  Name:  Denise Rhodes    MRN: [id number] DOB: Aug 13, 1940  09/14/2019  [name] was observed post Covid-19 immunization for 15 minutes without incidence. She was provided with Vaccine Information Sheet and instruction to access the V-Safe system.   [name] was instructed to call 911 with any severe reactions post vaccine: Marland Kitchen Difficulty breathing  . Swelling of your face and throat  . A fast heartbeat  . A bad rash all over your body  . Dizziness and weakness    Immunizations Administered    Name Date Dose VIS Date Route   Pfizer COVID-19 Vaccine 09/14/2019  9:04 AM 0.3 mL 07/17/2019 Intramuscular   Manufacturer: [place]   Lot: [id number]   [place]: [id number]

## 2019-09-29 DIAGNOSIS — M25511 Pain in right shoulder: Secondary | ICD-10-CM | POA: Diagnosis not present

## 2019-09-29 DIAGNOSIS — M25512 Pain in left shoulder: Secondary | ICD-10-CM | POA: Diagnosis not present

## 2019-10-02 ENCOUNTER — Other Ambulatory Visit: Payer: Self-pay | Admitting: Cardiovascular Disease

## 2019-10-07 ENCOUNTER — Ambulatory Visit: Payer: Medicare HMO | Attending: Internal Medicine

## 2019-10-07 DIAGNOSIS — Z23 Encounter for immunization: Secondary | ICD-10-CM | POA: Insufficient documentation

## 2019-10-07 NOTE — Progress Notes (Signed)
   Covid-19 Vaccination Clinic  Name:  Denise Rhodes    MRN: [id number] DOB: 1941/06/08  10/07/2019  [name] was observed post Covid-19 immunization for 15 minutes without incident. She was provided with Vaccine Information Sheet and instruction to access the V-Safe system.   [name] was instructed to call 911 with any severe reactions post vaccine: Marland Kitchen Difficulty breathing  . Swelling of face and throat  . A fast heartbeat  . A bad rash all over body  . Dizziness and weakness   Immunizations Administered    Name Date Dose VIS Date Route   Pfizer COVID-19 Vaccine 10/07/2019 10:05 AM 0.3 mL 07/17/2019 Intramuscular   Manufacturer: [place]   Lot: [id number]   [place]: [id number]

## 2019-10-30 ENCOUNTER — Other Ambulatory Visit: Payer: Self-pay

## 2019-10-30 ENCOUNTER — Telehealth: Payer: Self-pay

## 2019-10-30 ENCOUNTER — Ambulatory Visit
Admission: EM | Admit: 2019-10-30 | Discharge: 2019-10-30 | Disposition: A | Discharge status: Home / Self Care | Payer: Medicare HMO | Attending: Emergency Medicine | Admitting: Emergency Medicine

## 2019-10-30 DIAGNOSIS — N3090 Cystitis, unspecified without hematuria: Secondary | ICD-10-CM | POA: Diagnosis not present

## 2019-10-30 LAB — POCT URINALYSIS DIP (MANUAL ENTRY)
Bilirubin, UA: NEGATIVE
Glucose, UA: NEGATIVE mg/dL
Ketones, POC UA: NEGATIVE mg/dL
Nitrite, UA: NEGATIVE
Protein Ur, POC: NEGATIVE mg/dL
Spec Grav, UA: 1.01 (ref 1.010–1.025)
Urobilinogen, UA: 0.2 E.U./dL
pH, UA: 6 (ref 5.0–8.0)

## 2019-10-30 MED ORDER — CEPHALEXIN 500 MG PO CAPS: 500.0000 mg | ORAL_CAPSULE | Freq: Three times a day (TID) | ORAL | 0 refills | Status: AC

## 2019-10-30 NOTE — ED Provider Notes (Signed)
Roderic Palau    CSN: [id number] Arrival date & time: 10/30/19  1423      History   Chief Complaint Chief Complaint  Patient presents with  . Urinary Frequency    HPI Denise Rhodes is a 79 y.o. female.   Patient presents with urinary frequency, urgency, dysuria x3 days.  She has history of recurrent UTIs and is followed by urology.  She denies fever, chills, abdominal pain, back pain, vaginal symptoms, pelvic pain, or other symptoms.  Treatment attempted at home with increased fluid intake.  The history is provided by the patient.    Past Medical History:  Diagnosis Date  . Acute upper respiratory infections of unspecified site   . Anxiety state, unspecified   . Esophagitis 7/15   and gastritis on EGD-- Dr Gustavo Lah  . Gilbert's syndrome   . H/O: pneumonia 09/2006  . IBS (irritable bowel syndrome)   . Mitral valve disorders(424.0)   . Osteoporosis, unspecified   . Urinary tract infection, site not specified     Patient Active Problem List   Diagnosis Date Noted  . Shoulder pain 07/13/2019  . Recurrent UTI 03/20/2017  . GERD (gastroesophageal reflux disease) 11/26/2016  . Advance directive discussed with patient 11/16/2014  . Atrial tachycardia ([place]) 06/10/2013  . Routine general medical examination at a health care facility 11/21/2011  . IBS (irritable bowel syndrome)   . Episodic mood disorder ([place]) 03/20/2007  . Mitral valve disorder 03/20/2007  . Osteoporosis 03/20/2007    Past Surgical History:  Procedure Laterality Date  . Bladder tack    . CATARACT EXTRACTION, BILATERAL  9/12, 1/13  . TOTAL ABDOMINAL HYSTERECTOMY W/ BILATERAL SALPINGOOPHORECTOMY  2000  . Vaginectomy colpocleisis cysto  2/16    OB History   No obstetric history on file.      Home Medications    Prior to Admission medications   Medication Sig Start Date End Date Taking? Authorizing Provider  ALPRAZolam (XANAX) 0.25 MG tablet Take 1 tablet (0.25 mg total) by mouth 3  (three) times daily as needed. 11/27/17  Yes Venia Carbon, MD  estradiol (ESTRACE) 0.1 MG/GM vaginal cream Place 1 Applicatorful vaginally 3 (three) times a week. Patient taking differently: Place 1 Applicatorful vaginally as needed.  11/27/17  Yes Venia Carbon, MD  Magnesium 250 MG TABS Take by mouth daily.    Yes [provider]  metoprolol succinate (TOPROL-XL) 25 MG 24 hr tablet Take 1 tablet by mouth once daily 10/02/19  Yes Gollan, Kathlene November, MD  cephALEXin (KEFLEX) 500 MG capsule Take 1 capsule (500 mg total) by mouth 3 (three) times daily for 5 days. 10/30/19 11/04/19  Sharion Balloon, NP  sulfamethoxazole-trimethoprim (BACTRIM DS) 800-160 MG tablet Take 1 tablet by mouth 2 (two) times daily. 07/13/19   Venia Carbon, MD  trimethoprim (TRIMPEX) 100 MG tablet Take 1 tablet (100 mg total) by mouth daily. 07/13/19   Venia Carbon, MD    Family History Family History  Problem Relation Age of Onset  . Cancer Father        Lung  . Cancer Mother        Colon  . Hypertension Mother   . Diabetes Other        Paternal side  . Coronary artery disease Maternal Grandmother   . Coronary artery disease Paternal Grandmother     Social History Social History   Tobacco Use  . Smoking status: Never Smoker  . Smokeless tobacco: Never  Used  Substance Use Topics  . Alcohol use: No    Alcohol/week: 0.0 standard drinks    Comment: Rare  . Drug use: No     Allergies   Patient has no known allergies.   Review of Systems Review of Systems  Constitutional: Negative for chills and fever.  HENT: Negative for ear pain and sore throat.   Eyes: Negative for pain and visual disturbance.  Respiratory: Negative for cough and shortness of breath.   Cardiovascular: Negative for chest pain and palpitations.  Gastrointestinal: Negative for abdominal pain and vomiting.  Genitourinary: Positive for dysuria, frequency and urgency. Negative for flank pain, hematuria, pelvic pain and  vaginal discharge.  Musculoskeletal: Negative for arthralgias and back pain.  Skin: Negative for color change and rash.  Neurological: Negative for seizures and syncope.  All other systems reviewed and are negative.    Physical Exam Triage Vital Signs ED Triage Vitals  Enc Vitals Group     BP      Pulse      Resp      Temp      Temp src      SpO2      Weight      Height      Head Circumference      Peak Flow      Pain Score      Pain Loc      Pain Edu?      Excl. in [place]?    No data found.  Updated Vital Signs BP (!) 171/98 (BP Location: Left Arm)   Pulse 78   Temp 98.8 F (37.1 C) (Oral)   Resp 18   Ht 5\' 5"  (1.651 m)   Wt 150 lb (68 kg)   SpO2 98%   BMI 24.96 kg/m   Visual Acuity Right Eye Distance:   Left Eye Distance:   Bilateral Distance:    Right Eye Near:   Left Eye Near:    Bilateral Near:     Physical Exam Vitals and nursing note reviewed.  Constitutional:      General: She is not in acute distress.    Appearance: She is well-developed.  HENT:     Head: Normocephalic and atraumatic.     Mouth/Throat:     Mouth: Mucous membranes are moist.  Eyes:     Conjunctiva/sclera: Conjunctivae normal.  Cardiovascular:     Rate and Rhythm: Normal rate and regular rhythm.     Heart sounds: No murmur.  Pulmonary:     Effort: Pulmonary effort is normal. No respiratory distress.     Breath sounds: Normal breath sounds.  Abdominal:     General: Bowel sounds are normal.     Palpations: Abdomen is soft.     Tenderness: There is no abdominal tenderness. There is no left CVA tenderness, guarding or rebound.  Musculoskeletal:     Cervical back: Neck supple.  Skin:    General: Skin is warm and dry.     Findings: No rash.  Neurological:     General: No focal deficit present.     Mental Status: She is alert and oriented to person, place, and time.  Psychiatric:        Mood and Affect: Mood normal.        Behavior: Behavior normal.      UC Treatments  / Results  Labs (all labs ordered are listed, but only abnormal results are displayed) Labs Reviewed  POCT URINALYSIS DIP (MANUAL ENTRY) -  Abnormal; Notable for the following components:      Result Value   Blood, UA trace-intact (*)    Leukocytes, UA Large (3+) (*)    All other components within normal limits  URINE CULTURE    EKG   Radiology No results found.  Procedures Procedures (including critical care time)  Medications Ordered in UC Medications - No data to display  Initial Impression / Assessment and Plan / UC Course  I have reviewed the triage vital signs and the nursing notes.  Pertinent labs & imaging results that were available during my care of the patient were reviewed by me and considered in my medical decision making (see chart for details).   Cystitis.  Treating with Keflex.  Urine culture pending.  Discussed with patient that we will call her if the antibiotic needs to be changed.  Instructed her to follow-up with her urologist if her symptoms are not improving.  Discussed with patient that her blood pressure is elevated today and needs to be rechecked by her PCP in 2 to 4 weeks.  Patient agrees to plan of care.     Final Clinical Impressions(s) / UC Diagnoses   Final diagnoses:  Cystitis     Discharge Instructions     Take the antibiotic as directed. The urine culture is pending and we will call you if your antibiotic needs to be changed.    Follow-up with your urologist as scheduled.    Your blood pressure is elevated today at 171/98.  Please have this rechecked by your primary care provider in 2-4 weeks.         ED Prescriptions    Medication Sig Dispense Auth. Provider   cephALEXin (KEFLEX) 500 MG capsule Take 1 capsule (500 mg total) by mouth 3 (three) times daily for 5 days. 15 capsule Sharion Balloon, NP     PDMP not reviewed this encounter.   Sharion Balloon, NP 10/30/19 [phone]

## 2019-10-30 NOTE — Telephone Encounter (Signed)
Please check on her on Monday 

## 2019-10-30 NOTE — Discharge Instructions (Addendum)
Take the antibiotic as directed. The urine culture is pending and we will call you if your antibiotic needs to be changed.    Follow-up with your urologist as scheduled.    Your blood pressure is elevated today at 171/98.  Please have this rechecked by your primary care provider in 2-4 weeks.

## 2019-10-30 NOTE — ED Triage Notes (Signed)
Patient complains of urinary frequency, urgency and pain that started 3 days ago.

## 2019-10-30 NOTE — Telephone Encounter (Signed)
For 2-3 days pt has had discomfort upon urination, frequency of urine and voiding smaller amts than usual. No abd or back pain and no fever.Pt has no covid symptoms, no travel and no known exposure to + covid. No available appts at [facility] and pt will go to [facility] in [place]. FYI to Dr Silvio Pate.

## 2019-10-31 LAB — URINE CULTURE

## 2019-11-02 NOTE — Telephone Encounter (Signed)
Left message on VM per DPR to see how she as feeling. She did go to UC.

## 2019-11-03 NOTE — Telephone Encounter (Signed)
Spoke to pt. She did have a UTI. Placed on antibiotic. She is feeling better. Her BP was a little elevated at the UC. Once she got home it went down. Appreciated the call.

## 2019-11-10 DIAGNOSIS — M25512 Pain in left shoulder: Secondary | ICD-10-CM | POA: Diagnosis not present

## 2019-11-10 DIAGNOSIS — G5601 Carpal tunnel syndrome, right upper limb: Secondary | ICD-10-CM | POA: Diagnosis not present

## 2019-11-11 DIAGNOSIS — N390 Urinary tract infection, site not specified: Secondary | ICD-10-CM | POA: Diagnosis not present

## 2019-11-11 DIAGNOSIS — N399 Disorder of urinary system, unspecified: Secondary | ICD-10-CM | POA: Diagnosis not present

## 2019-11-11 DIAGNOSIS — R390 Extravasation of urine: Secondary | ICD-10-CM | POA: Diagnosis not present

## 2019-11-11 DIAGNOSIS — N952 Postmenopausal atrophic vaginitis: Secondary | ICD-10-CM | POA: Diagnosis not present

## 2019-11-11 DIAGNOSIS — R300 Dysuria: Secondary | ICD-10-CM | POA: Diagnosis not present

## 2019-12-17 DIAGNOSIS — K121 Other forms of stomatitis: Secondary | ICD-10-CM | POA: Diagnosis not present

## 2019-12-17 DIAGNOSIS — K136 Irritative hyperplasia of oral mucosa: Secondary | ICD-10-CM | POA: Diagnosis not present

## 2019-12-21 DIAGNOSIS — N952 Postmenopausal atrophic vaginitis: Secondary | ICD-10-CM | POA: Diagnosis not present

## 2019-12-21 DIAGNOSIS — N390 Urinary tract infection, site not specified: Secondary | ICD-10-CM | POA: Diagnosis not present

## 2019-12-21 DIAGNOSIS — N3020 Other chronic cystitis without hematuria: Secondary | ICD-10-CM | POA: Diagnosis not present

## 2019-12-21 DIAGNOSIS — N3010 Interstitial cystitis (chronic) without hematuria: Secondary | ICD-10-CM | POA: Diagnosis not present

## 2019-12-21 DIAGNOSIS — N3281 Overactive bladder: Secondary | ICD-10-CM | POA: Diagnosis not present

## 2019-12-28 DIAGNOSIS — K136 Irritative hyperplasia of oral mucosa: Secondary | ICD-10-CM | POA: Diagnosis not present

## 2019-12-28 DIAGNOSIS — K121 Other forms of stomatitis: Secondary | ICD-10-CM | POA: Diagnosis not present

## 2020-01-12 DIAGNOSIS — N3020 Other chronic cystitis without hematuria: Secondary | ICD-10-CM | POA: Diagnosis not present

## 2020-01-12 DIAGNOSIS — N952 Postmenopausal atrophic vaginitis: Secondary | ICD-10-CM | POA: Diagnosis not present

## 2020-01-12 DIAGNOSIS — N399 Disorder of urinary system, unspecified: Secondary | ICD-10-CM | POA: Diagnosis not present

## 2020-01-12 DIAGNOSIS — N390 Urinary tract infection, site not specified: Secondary | ICD-10-CM | POA: Diagnosis not present

## 2020-01-15 ENCOUNTER — Encounter: Payer: Self-pay | Admitting: Internal Medicine

## 2020-01-15 ENCOUNTER — Ambulatory Visit (INDEPENDENT_AMBULATORY_CARE_PROVIDER_SITE_OTHER): Payer: Medicare HMO | Admitting: Internal Medicine

## 2020-01-15 ENCOUNTER — Other Ambulatory Visit: Payer: Self-pay

## 2020-01-15 VITALS — BP 134/86 | HR 72 | Temp 97.6°F | Ht 64.0 in | Wt 149.0 lb

## 2020-01-15 DIAGNOSIS — Z0000 Encounter for general adult medical examination without abnormal findings: Secondary | ICD-10-CM | POA: Diagnosis not present

## 2020-01-15 DIAGNOSIS — N390 Urinary tract infection, site not specified: Secondary | ICD-10-CM | POA: Diagnosis not present

## 2020-01-15 DIAGNOSIS — R69 Illness, unspecified: Secondary | ICD-10-CM | POA: Diagnosis not present

## 2020-01-15 DIAGNOSIS — Z7189 Other specified counseling: Secondary | ICD-10-CM

## 2020-01-15 DIAGNOSIS — F39 Unspecified mood [affective] disorder: Secondary | ICD-10-CM

## 2020-01-15 DIAGNOSIS — I471 Supraventricular tachycardia: Secondary | ICD-10-CM | POA: Diagnosis not present

## 2020-01-15 NOTE — Assessment & Plan Note (Signed)
No symptomatic recurrence on the metoprolol

## 2020-01-15 NOTE — Assessment & Plan Note (Signed)
See social history 

## 2020-01-15 NOTE — Assessment & Plan Note (Signed)
I have personally reviewed the Medicare Annual Wellness questionnaire and have noted 1. The patient's medical and social history 2. Their use of alcohol, tobacco or illicit drugs 3. Their current medications and supplements 4. The patient's functional ability including ADL's, fall risks, home safety risks and hearing or visual             impairment. 5. Diet and physical activities 6. Evidence for depression or mood disorders  The patients weight, height, BMI and visual acuity have been recorded in the chart I have made referrals, counseling and provided education to the patient based review of the above and I have provided the pt with a written personalized care plan for preventive services.  I have provided you with a copy of your personalized plan for preventive services. Please take the time to review along with your updated medication list.  She will set up mammogram Done with screening colonoscopy Flu vaccine in the fall Td if any injury Discussed exercise

## 2020-01-15 NOTE — Assessment & Plan Note (Signed)
Anxiety is fairly persistent Mostly gets by with prayer---uses xanax prn

## 2020-01-15 NOTE — Progress Notes (Signed)
Subjective:    Patient ID: Denise Rhodes, female    DOB: 05-06-1941, 79 y.o.   MRN: [id number]  HPI Here with husband for Medicare wellness visit and follow up of chronic health conditions This visit occurred during the SARS-CoV-2 public health emergency.  Safety protocols were in place, including screening questions prior to the visit, additional usage of staff PPE, and extensive cleaning of exam room while observing appropriate contact time as indicated for disinfecting solutions.   Reviewed form and advanced directives Reviewed other doctors No alcohol or tobacco Not really exercising--discussed Vision and hearing are fine No falls--but has to be careful (balance issues) Independent with instrumental ADLs Minor memory issues (forgeting what she went to pantry for)--nothing worrisome  Since last visit---right shoulder improved Saw ortho for left shoulder---bursa injection really improved it Got meloxicam for brief use  Seeing the urologist for the recurrent UTIs Using estrogen cream every other day On special cranberry supplement also  No symptoms of tachycardia since on metoprolol Stays active and no problems with this No chest pain or SOB No dizziness or syncope  Still gets spells of anxiety--fairly frequently Tries to use pray to calm herself Will need the xanax a few times a month No depression or anhedonia  More acid symptoms lately Better if she keeps her bowels regular Uses OTC med prn No dysphagia Some bloating--better with baking soda/water  Current Outpatient Medications on File Prior to Visit  Medication Sig Dispense Refill  . ALPRAZolam (XANAX) 0.25 MG tablet Take 1 tablet (0.25 mg total) by mouth 3 (three) times daily as needed. 30 tablet 0  . estradiol (ESTRACE) 0.1 MG/GM vaginal cream Place 1 Applicatorful vaginally 3 (three) times a week. (Patient taking differently: Place 1 Applicatorful vaginally as needed. ) 42.5 g 12  . Magnesium 250 MG TABS Take  by mouth daily.     . metoprolol succinate (TOPROL-XL) 25 MG 24 hr tablet Take 1 tablet by mouth once daily 90 tablet 1   No current facility-administered medications on file prior to visit.    No Known Allergies  Past Medical History:  Diagnosis Date  . Acute upper respiratory infections of unspecified site   . Anxiety state, unspecified   . Esophagitis 7/15   and gastritis on EGD-- Dr Gustavo Lah  . Gilbert's syndrome   . H/O: pneumonia 09/2006  . IBS (irritable bowel syndrome)   . Mitral valve disorders(424.0)   . Osteoporosis, unspecified   . Urinary tract infection, site not specified     Past Surgical History:  Procedure Laterality Date  . Bladder tack    . CATARACT EXTRACTION, BILATERAL  9/12, 1/13  . TOTAL ABDOMINAL HYSTERECTOMY W/ BILATERAL SALPINGOOPHORECTOMY  2000  . Vaginectomy colpocleisis cysto  2/16    Family History  Problem Relation Age of Onset  . Cancer Father        Lung  . Cancer Mother        Colon  . Hypertension Mother   . Diabetes Other        Paternal side  . Coronary artery disease Maternal Grandmother   . Coronary artery disease Paternal Grandmother     Social History   Socioeconomic History  . Marital status: Married    Spouse name: Not on file  . Number of children: 3  . Years of education: Not on file  . Highest education level: Not on file  Occupational History  . 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  Smoking status: Never Smoker  . Smokeless tobacco: Never Used  Vaping Use  . Vaping Use: Never used  Substance and Sexual Activity  . Alcohol use: No    Alcohol/week: 0.0 standard drinks    Comment: Rare  . Drug use: No  . Sexual activity: Not on file  Other Topics Concern  . Not on file  Social History Narrative   Has living will    Husband is health care POA--- alternate is children    Would accept resuscitation attempts.   Would not want tube feeds if cognitively unaware   Social  Determinants of Health   Financial Resource Strain:   . Difficulty of Paying Living Expenses:   Food Insecurity:   . Worried About Charity fundraiser in the Last Year:   . Arboriculturist in the Last Year:   Transportation Needs:   . Film/video editor (Medical):   Marland Kitchen Lack of Transportation (Non-Medical):   Physical Activity:   . Days of Exercise per Week:   . Minutes of Exercise per Session:   Stress:   . Feeling of Stress :   Social Connections:   . Frequency of Communication with Friends and Family:   . Frequency of Social Gatherings with Friends and Family:   . Attends Religious Services:   . Active Member of Clubs or Organizations:   . Attends Archivist Meetings:   Marland Kitchen Marital Status:   Intimate Partner Violence:   . Fear of Current or Ex-Partner:   . Emotionally Abused:   Marland Kitchen Physically Abused:   . Sexually Abused:    Review of Systems Appetite is good Weight is stable Sleeps well Wears seat belt Just had dental surgery No suspicious skin lesions No sig back or joint pains    Objective:   Physical Exam  Constitutional: She is oriented to person, place, and time.  Non-toxic appearance.  HENT:  Head: Normocephalic and atraumatic.  Mouth/Throat: Mucous membranes are moist.  Cardiovascular: Normal rate, regular rhythm and normal pulses. Exam reveals no gallop.  No murmur heard. Respiratory: Effort normal and breath sounds normal. She has no wheezes. She has no rales.  GI: Soft. There is no abdominal tenderness.  Musculoskeletal:     Cervical back: Normal range of motion.     Right lower leg: No edema.     Left lower leg: No edema.  Lymphadenopathy:    She has no cervical adenopathy.  Neurological: She is alert and oriented to person, place, and time.  President---"Biden, Trump, Obama" Can't do numbers D-l-r-o-w Recall 3/3  Skin: Skin is warm. No rash noted.  Psychiatric: Her behavior is normal. Mood normal.           Assessment & Plan:

## 2020-01-15 NOTE — Assessment & Plan Note (Signed)
Controlled with vaginal estrogen and cranberry extract

## 2020-03-31 ENCOUNTER — Other Ambulatory Visit: Payer: Self-pay | Admitting: Cardiovascular Disease

## 2020-05-17 ENCOUNTER — Ambulatory Visit: Payer: Medicare HMO | Admitting: Cardiovascular Disease

## 2020-05-23 ENCOUNTER — Ambulatory Visit: Payer: Medicare HMO | Attending: Internal Medicine

## 2020-05-23 ENCOUNTER — Ambulatory Visit: Payer: Self-pay

## 2020-05-23 DIAGNOSIS — Z23 Encounter for immunization: Secondary | ICD-10-CM

## 2020-05-23 NOTE — Progress Notes (Signed)
   Covid-19 Vaccination Clinic  Name:  Denise Rhodes    MRN: [id number] DOB: January 06, 1941  05/23/2020  Denise Rhodes was observed post Covid-19 immunization for 15 minutes without incident. She was provided with Vaccine Information Sheet and instruction to access the V-Safe system.   Denise Rhodes was instructed to call 911 with any severe reactions post vaccine: Marland Kitchen Difficulty breathing  . Swelling of face and throat  . A fast heartbeat  . A bad rash all over body  . Dizziness and weakness

## 2020-05-26 DIAGNOSIS — N3281 Overactive bladder: Secondary | ICD-10-CM | POA: Diagnosis not present

## 2020-05-26 DIAGNOSIS — N3020 Other chronic cystitis without hematuria: Secondary | ICD-10-CM | POA: Diagnosis not present

## 2020-05-26 DIAGNOSIS — N952 Postmenopausal atrophic vaginitis: Secondary | ICD-10-CM | POA: Diagnosis not present

## 2020-05-26 DIAGNOSIS — N390 Urinary tract infection, site not specified: Secondary | ICD-10-CM | POA: Diagnosis not present

## 2020-05-27 DIAGNOSIS — L814 Other melanin hyperpigmentation: Secondary | ICD-10-CM | POA: Diagnosis not present

## 2020-05-27 DIAGNOSIS — L821 Other seborrheic keratosis: Secondary | ICD-10-CM | POA: Diagnosis not present

## 2020-05-27 DIAGNOSIS — D229 Melanocytic nevi, unspecified: Secondary | ICD-10-CM | POA: Diagnosis not present

## 2020-06-28 ENCOUNTER — Ambulatory Visit: Payer: Medicare HMO | Admitting: Cardiovascular Disease

## 2020-07-05 ENCOUNTER — Other Ambulatory Visit: Payer: Self-pay | Admitting: Cardiovascular Disease

## 2020-07-06 DIAGNOSIS — M3501 Sicca syndrome with keratoconjunctivitis: Secondary | ICD-10-CM | POA: Diagnosis not present

## 2020-07-07 ENCOUNTER — Ambulatory Visit (INDEPENDENT_AMBULATORY_CARE_PROVIDER_SITE_OTHER): Payer: Medicare HMO

## 2020-07-07 DIAGNOSIS — Z23 Encounter for immunization: Secondary | ICD-10-CM

## 2020-08-01 DIAGNOSIS — R300 Dysuria: Secondary | ICD-10-CM | POA: Diagnosis not present

## 2020-08-01 DIAGNOSIS — N390 Urinary tract infection, site not specified: Secondary | ICD-10-CM | POA: Diagnosis not present

## 2020-08-06 ENCOUNTER — Other Ambulatory Visit: Payer: Self-pay | Admitting: Cardiovascular Disease

## 2020-08-08 ENCOUNTER — Other Ambulatory Visit: Payer: Self-pay

## 2020-08-08 MED ORDER — METOPROLOL SUCCINATE ER 25 MG PO TB24: 25.0000 mg | ORAL_TABLET | Freq: Every day | ORAL | 0 refills | Status: DC

## 2020-08-08 NOTE — Progress Notes (Signed)
Cardiology Office Note  Date:  08/09/2020   ID:  [name], DOB July 12, 1941, MRN [id number]  PCP:  Venia Carbon, MD   Chief Complaint  Patient presents with  . Follow-up    12 month F/U-No new cardiac concerns    HPI:  Denise Rhodes is a 80 y.o. female w/ PMHx  anxiety, Insomnia IBS  palpitations 48 hour Holter monitor:  runs of atrial tachycardia, frequent APCs, bigeminal pattern. possible mitral valve prolapse Echocardiogram 2013, normal ejection fraction She presents today for follow-up of her palpitations  Helps with grandchildren 5 to 70 yo  Getting funny feeling in bed at night, Happens couple times a month could be stress Xanax helps  No regular exercise program " I know I need to" Though stays active during the daytime, does ADLs, housework Likes to [employer] a big dinner, takes her until 8 PM to clean up, then is tired  Little breakthrough tachycardia on the metoprolol  Total cholesterol around 160  EKG personally reviewed by myself on todays visit NSR rate 75 bpm  Prior monitor showed runs of atrial tachycardia, frequent APCs, sometimes in a bigeminal pattern.  Previous echo indicated EF 0000000, grade 1 diastolic dysfunction, structurally normal MV with normal leaflet separation, trivial MR.   PMH:   has a past medical history of Acute upper respiratory infections of unspecified site, Anxiety state, unspecified, Esophagitis (7/15), Gilbert's syndrome, H/O: pneumonia (09/2006), IBS (irritable bowel syndrome), Mitral valve disorders(424.0), Osteoporosis, unspecified, and Urinary tract infection, site not specified.  PSH:    Past Surgical History:  Procedure Laterality Date  . Bladder tack    . CATARACT EXTRACTION, BILATERAL  9/12, 1/13  . MINOR EXCISION OF ORAL LESION    . TOTAL ABDOMINAL HYSTERECTOMY W/ BILATERAL SALPINGOOPHORECTOMY  2000  . Vaginectomy colpocleisis cysto  2/16    Current Outpatient Medications  Medication Sig Dispense Refill  .  Alpha-Lipoic Acid 300 MG TABS Take 300 mg by mouth daily.    Marland Kitchen ALPRAZolam (XANAX) 0.25 MG tablet Take 1 tablet (0.25 mg total) by mouth 3 (three) times daily as needed. 30 tablet 0  . CYANOCOBALAMIN PO Take by mouth daily.    Marland Kitchen estradiol (ESTRACE) 0.1 MG/GM vaginal cream Place 1 Applicatorful vaginally 3 (three) times a week. 42.5 g 12  . Magnesium 250 MG TABS Take by mouth daily.     . metoprolol succinate (TOPROL-XL) 25 MG 24 hr tablet Take 1 tablet (25 mg total) by mouth daily. 30 tablet 0  . Pyridoxine HCl (VITAMIN B-6 PO) Take 600 mg by mouth daily. With NAC     No current facility-administered medications for this visit.    Allergies:   Patient has no known allergies.   Social History:  The patient  reports that she has never smoked. She has never used smokeless tobacco. She reports that she does not drink alcohol and does not use drugs.   Family History:   family history includes Cancer in her father and mother; Coronary artery disease in her maternal grandmother and paternal grandmother; Diabetes in an other family member; Hypertension in her mother.    Review of Systems: Review of Systems  Constitutional: Negative.   Respiratory: Negative.   Cardiovascular: Negative.   Gastrointestinal: Negative.   Musculoskeletal: Negative.   Neurological: Negative.   Psychiatric/Behavioral: Negative.   All other systems reviewed and are negative.    PHYSICAL EXAM: VS:  BP (!) 150/90 (BP Location: Left Arm, Patient Position: Sitting, Cuff Size: Normal)  Pulse 75   Ht 5\' 4"  (1.626 m)   Wt 151 lb (68.5 kg)   SpO2 94%   BMI 25.92 kg/m  , BMI Body mass index is 25.92 kg/m. Constitutional:  oriented to person, place, and time. No distress.  HENT:  Head: Grossly normal Eyes:  no discharge. No scleral icterus.  Neck: No JVD, no carotid bruits  Cardiovascular: Regular rate and rhythm, no murmurs appreciated Pulmonary/Chest: Clear to auscultation bilaterally, no wheezes or  rails Abdominal: Soft.  no distension.  no tenderness.  Musculoskeletal: Normal range of motion Neurological:  normal muscle tone. Coordination normal. No atrophy Skin: Skin warm and dry Psychiatric: normal affect, pleasant   Recent Labs: No results found for requested labs within last 8760 hours.    Lipid Panel Lab Results  Component Value Date   CHOL 164 11/16/2014   HDL 61.40 11/16/2014   LDLCALC 88 11/16/2014   TRIG 71.0 11/16/2014      Wt Readings from Last 3 Encounters:  08/09/20 151 lb (68.5 kg)  01/15/20 149 lb (67.6 kg)  10/30/19 150 lb (68 kg)     ASSESSMENT AND PLAN:  Atrial tachycardia (HCC)  Doing well on metoprolol succinate 25 daily Little breakthrough, no medication changes made  Anxiety Recommended exercise program, she is sedentary at baseline Stress reduction techniques She continues to take Xanax as needed  Essential hypertension Blood pressure is well controlled on today's visit. No changes made to the medications.    Total encounter time more than 25 minutes  Greater than 50% was spent in counseling and coordination of care with the patient    No orders of the defined types were placed in this encounter.    Signed, 11/01/19, M.D., Ph.D. 08/09/2020  [facility] [place], [place]

## 2020-08-09 ENCOUNTER — Ambulatory Visit: Payer: Medicare HMO | Admitting: Cardiovascular Disease

## 2020-08-09 ENCOUNTER — Other Ambulatory Visit: Payer: Self-pay

## 2020-08-09 ENCOUNTER — Encounter: Payer: Self-pay | Admitting: Cardiovascular Disease

## 2020-08-09 VITALS — BP 150/90 | HR 75 | Ht 64.0 in | Wt 151.0 lb

## 2020-08-09 DIAGNOSIS — I471 Supraventricular tachycardia: Secondary | ICD-10-CM | POA: Diagnosis not present

## 2020-08-09 DIAGNOSIS — I059 Rheumatic mitral valve disease, unspecified: Secondary | ICD-10-CM

## 2020-08-09 DIAGNOSIS — R002 Palpitations: Secondary | ICD-10-CM

## 2020-08-09 NOTE — Patient Instructions (Signed)
Medication Instructions:  No changes  If you need a refill on your cardiac medications before your next appointment, please call your pharmacy.    Lab work: No new labs needed   If you have labs (blood work) drawn today and your tests are completely normal, you will receive your results only by: . MyChart Message (if you have MyChart) OR . A paper copy in the mail If you have any lab test that is abnormal or we need to change your treatment, we will call you to review the results.   Testing/Procedures: No new testing needed   Follow-Up: At [facility], you and your health needs are our priority.  As part of our continuing mission to provide you with exceptional heart care, we have created designated Provider Care Teams.  These Care Teams include your primary Cardiologist (physician) and Advanced Practice Providers (APPs -  Physician Assistants and Nurse Practitioners) who all work together to provide you with the care you need, when you need it.  . You will need a follow up appointment in 12 months  . Providers on your designated Care Team:   . Christopher Berge, NP . Ryan Dunn, PA-C . Jacquelyn Visser, PA-C  Any Other Special Instructions Will Be Listed Below (If Applicable).  COVID-19 Vaccine Information can be found at: [email or web address] For questions related to vaccine distribution or appointments, please email [email or web address] or call [phone].     

## 2020-09-07 ENCOUNTER — Other Ambulatory Visit: Payer: Self-pay | Admitting: Cardiovascular Disease

## 2020-09-07 NOTE — Telephone Encounter (Signed)
Rx request sent to pharmacy.  

## 2020-09-21 DIAGNOSIS — Z1231 Encounter for screening mammogram for malignant neoplasm of breast: Secondary | ICD-10-CM | POA: Diagnosis not present

## 2020-09-29 DIAGNOSIS — M2011 Hallux valgus (acquired), right foot: Secondary | ICD-10-CM | POA: Diagnosis not present

## 2020-09-29 DIAGNOSIS — M79674 Pain in right toe(s): Secondary | ICD-10-CM | POA: Diagnosis not present

## 2020-09-29 DIAGNOSIS — L03031 Cellulitis of right toe: Secondary | ICD-10-CM | POA: Diagnosis not present

## 2020-09-29 DIAGNOSIS — B351 Tinea unguium: Secondary | ICD-10-CM | POA: Diagnosis not present

## 2020-09-29 DIAGNOSIS — M79675 Pain in left toe(s): Secondary | ICD-10-CM | POA: Diagnosis not present

## 2020-10-11 DIAGNOSIS — L03031 Cellulitis of right toe: Secondary | ICD-10-CM | POA: Diagnosis not present

## 2020-10-12 DIAGNOSIS — N3000 Acute cystitis without hematuria: Secondary | ICD-10-CM | POA: Diagnosis not present

## 2020-10-12 DIAGNOSIS — N952 Postmenopausal atrophic vaginitis: Secondary | ICD-10-CM | POA: Diagnosis not present

## 2020-10-12 DIAGNOSIS — N3020 Other chronic cystitis without hematuria: Secondary | ICD-10-CM | POA: Diagnosis not present

## 2020-12-27 ENCOUNTER — Encounter: Payer: Self-pay | Admitting: Cardiovascular Disease

## 2021-01-11 DIAGNOSIS — N3020 Other chronic cystitis without hematuria: Secondary | ICD-10-CM | POA: Diagnosis not present

## 2021-01-11 DIAGNOSIS — N39498 Other specified urinary incontinence: Secondary | ICD-10-CM | POA: Diagnosis not present

## 2021-01-11 DIAGNOSIS — N3000 Acute cystitis without hematuria: Secondary | ICD-10-CM | POA: Diagnosis not present

## 2021-01-11 DIAGNOSIS — M8550 Aneurysmal bone cyst, unspecified site: Secondary | ICD-10-CM | POA: Diagnosis not present

## 2021-01-11 DIAGNOSIS — N952 Postmenopausal atrophic vaginitis: Secondary | ICD-10-CM | POA: Diagnosis not present

## 2021-03-07 ENCOUNTER — Telehealth: Payer: Self-pay | Admitting: Cardiovascular Disease

## 2021-03-07 NOTE — Telephone Encounter (Signed)
*  STAT* If patient is at the pharmacy, call can be transferred to refill team.   1. Which medications need to be refilled? (please list name of each medication and dose if known) metoprolol succinate 25 MG 1 tablet daily   2. Which pharmacy/location (including street and city if local pharmacy) is medication to be sent to? [facility] on [address]   3. Do they need a 30 day or 90 day supply? 90 day

## 2021-03-08 MED ORDER — METOPROLOL SUCCINATE ER 25 MG PO TB24: 25.0000 mg | ORAL_TABLET | Freq: Every day | ORAL | 2 refills | Status: DC

## 2021-03-08 NOTE — Telephone Encounter (Signed)
Requested Prescriptions   Signed Prescriptions Disp Refills   metoprolol succinate (TOPROL-XL) 25 MG 24 hr tablet 90 tablet 2    Sig: Take 1 tablet (25 mg total) by mouth daily.    Authorizing Provider: Minna Merritts    Ordering User: Britt Bottom

## 2021-03-09 ENCOUNTER — Encounter: Payer: Medicare HMO | Admitting: Internal Medicine

## 2021-03-13 ENCOUNTER — Telehealth: Payer: Self-pay | Admitting: *Deleted

## 2021-03-13 NOTE — Telephone Encounter (Signed)
Patient's husband left a voicemail stating that he and his wife got covid while on vacation. Patient's husband requested a call back. Tried to call patient's husband back and got his voicemail. Left a message to call the office back.

## 2021-03-13 NOTE — Telephone Encounter (Signed)
Spoke to pt's wife. They will let us know if no improvement in the next few days.

## 2021-03-13 NOTE — Telephone Encounter (Signed)
Spoke to patient's husband Delfino Lovett and was advised that she tested positive for covid on 02/28/21 while on vacation in [place]. Patient's husband stated that their previous doctor in [place] prescribed his wife Paxlovid . Patient's husband stated that she got to feeling better, but now has had a low grade fever of about 100.2 for the past 4 days. Patient's husband stated that he has been giving her Ibuprofen for the fever which seems to help. Patient's husband just wanted to keep Dr. Silvio Pate in the loop. Patient's husband wants to know what Dr. Silvio Pate wood recommend that they do. Patient's husband was given ER precautions and he verbalized understanding.  Pharmacy-[facility]

## 2021-04-12 ENCOUNTER — Other Ambulatory Visit: Payer: Self-pay

## 2021-04-12 ENCOUNTER — Ambulatory Visit (INDEPENDENT_AMBULATORY_CARE_PROVIDER_SITE_OTHER): Payer: Medicare HMO | Admitting: Internal Medicine

## 2021-04-12 ENCOUNTER — Encounter: Payer: Self-pay | Admitting: Internal Medicine

## 2021-04-12 VITALS — BP 138/86 | HR 66 | Temp 97.3°F | Ht 64.0 in | Wt 151.0 lb

## 2021-04-12 DIAGNOSIS — K219 Gastro-esophageal reflux disease without esophagitis: Secondary | ICD-10-CM | POA: Diagnosis not present

## 2021-04-12 DIAGNOSIS — Z23 Encounter for immunization: Secondary | ICD-10-CM | POA: Diagnosis not present

## 2021-04-12 DIAGNOSIS — R69 Illness, unspecified: Secondary | ICD-10-CM | POA: Diagnosis not present

## 2021-04-12 DIAGNOSIS — F39 Unspecified mood [affective] disorder: Secondary | ICD-10-CM

## 2021-04-12 DIAGNOSIS — Z0000 Encounter for general adult medical examination without abnormal findings: Secondary | ICD-10-CM | POA: Diagnosis not present

## 2021-04-12 DIAGNOSIS — N390 Urinary tract infection, site not specified: Secondary | ICD-10-CM | POA: Diagnosis not present

## 2021-04-12 DIAGNOSIS — I471 Supraventricular tachycardia: Secondary | ICD-10-CM | POA: Diagnosis not present

## 2021-04-12 LAB — COMPREHENSIVE METABOLIC PANEL
ALT: 17 U/L (ref 0–35)
AST: 19 U/L (ref 0–37)
Albumin: 3.9 g/dL (ref 3.5–5.2)
Alkaline Phosphatase: 66 U/L (ref 39–117)
BUN: 14 mg/dL (ref 6–23)
CO2: 29 mEq/L (ref 19–32)
Calcium: 9.2 mg/dL (ref 8.4–10.5)
Chloride: 105 mEq/L (ref 96–112)
Creatinine, Ser: 0.64 mg/dL (ref 0.40–1.20)
GFR: 83.61 mL/min (ref >60.00)
Glucose, Bld: 89 mg/dL (ref 70–99)
Potassium: 4.3 mEq/L (ref 3.5–5.1)
Sodium: 141 mEq/L (ref 135–145)
Total Bilirubin: 1.2 mg/dL (ref 0.2–1.2)
Total Protein: 6.2 g/dL (ref 6.0–8.3)

## 2021-04-12 LAB — CBC
HCT: 39.2 % (ref 36.0–46.0)
Hemoglobin: 12.7 g/dL (ref 12.0–15.0)
MCHC: 32.3 g/dL (ref 30.0–36.0)
MCV: 89.6 fl (ref 78.0–100.0)
Platelets: 208 10*3/uL (ref 150.0–400.0)
RBC: 4.37 Mil/uL (ref 3.87–5.11)
RDW: 14.6 % (ref 11.5–15.5)
WBC: 6.2 10*3/uL (ref 4.0–10.5)

## 2021-04-12 LAB — T4, FREE: Free T4: 0.89 ng/dL (ref 0.60–1.60)

## 2021-04-12 MED ORDER — ALPRAZOLAM 0.25 MG PO TABS: 0.2500 mg | ORAL_TABLET | Freq: Three times a day (TID) | ORAL | 0 refills | Status: DC | PRN

## 2021-04-12 NOTE — Assessment & Plan Note (Signed)
No recurrence on the metoprolol 

## 2021-04-12 NOTE — Assessment & Plan Note (Signed)
Hasn't need meds recently---will use OTC (like tums) prn

## 2021-04-12 NOTE — Addendum Note (Signed)
Addended by: Pilar Grammes on: 04/12/2021 02:45 PM   Modules accepted: Orders

## 2021-04-12 NOTE — Assessment & Plan Note (Signed)
I have personally reviewed the Medicare Annual Wellness questionnaire and have noted 1. The patient's medical and social history 2. Their use of alcohol, tobacco or illicit drugs 3. Their current medications and supplements 4. The patient's functional ability including ADL's, fall risks, home safety risks and hearing or visual             impairment. 5. Diet and physical activities 6. Evidence for depression or mood disorders  The patients weight, height, BMI and visual acuity have been recorded in the chart I have made referrals, counseling and provided education to the patient based review of the above and I have provided the pt with a written personalized care plan for preventive services.  I have provided you with a copy of your personalized plan for preventive services. Please take the time to review along with your updated medication list.  Discussed exercise--but is healthy Flu vaccine today Bivalent COVID when available Done with cancer screening now

## 2021-04-12 NOTE — Progress Notes (Signed)
Subjective:    Patient ID: Denise Rhodes, female    DOB: [birth year], 80 y.o.   MRN: [id number]  HPI Here with husband for Medicare wellness visit and follow up of chronic health conditions This visit occurred during the SARS-CoV-2 public health emergency.  Safety protocols were in place, including screening questions prior to the visit, additional usage of staff PPE, and extensive cleaning of exam room while observing appropriate contact time as indicated for disinfecting solutions.   Reviewed form and advanced directives Reviewed other doctors No alcohol or tobacco No exercise---discussed Vision is okay--some trouble reading (needs to use eye drops for dry eye) Some hearing problems--when in noisy environment No falls Independent with instrumental ADLs Mild memory issues--but no worse and not worrisome  Has some bad days---stress and anxiety Will use xanax then No depression or anhedonia Sleeps okay  No palpitations since on metoprolol No chest pain or SOB Will get woozy upon standing at times--better if just sits No sig edema  No symptoms of UTI Does follow with urologist--will have abnormal urinalysis at times On estrogen cream and cranberry  Current Outpatient Medications on File Prior to Visit  Medication Sig Dispense Refill   Alpha-Lipoic Acid 300 MG TABS Take 300 mg by mouth daily.     ALPRAZolam (XANAX) 0.25 MG tablet Take 1 tablet (0.25 mg total) by mouth 3 (three) times daily as needed. 30 tablet 0   Ascorbic Acid (VITAMIN C PO) Take by mouth.     Cranberry 400 MG CAPS Take by mouth.     CYANOCOBALAMIN PO Take by mouth daily.     estradiol (ESTRACE) 0.1 MG/GM vaginal cream Place 1 Applicatorful vaginally 3 (three) times a week. 42.5 g 12   Magnesium 250 MG TABS Take by mouth daily.      metoprolol succinate (TOPROL-XL) 25 MG 24 hr tablet Take 1 tablet (25 mg total) by mouth daily. 90 tablet 2   Pyridoxine HCl (VITAMIN B-6 PO) Take 600 mg by mouth daily. With NAC      VITAMIN D, CHOLECALCIFEROL, PO Take by mouth.     No current facility-administered medications on file prior to visit.    No Known Allergies  Past Medical History:  Diagnosis Date   Acute upper respiratory infections of unspecified site    Anxiety state, unspecified    Esophagitis 7/15   and gastritis on EGD-- Dr Gustavo Lah   Gilbert's syndrome    H/O: pneumonia 09/2006   IBS (irritable bowel syndrome)    Mitral valve disorders(424.0)    Osteoporosis, unspecified    Urinary tract infection, site not specified     Past Surgical History:  Procedure Laterality Date   Bladder tack     CATARACT EXTRACTION, BILATERAL  9/12, 1/13   MINOR EXCISION OF ORAL LESION     TOTAL ABDOMINAL HYSTERECTOMY W/ BILATERAL SALPINGOOPHORECTOMY  2000   Vaginectomy colpocleisis cysto  2/16    Family History  Problem Relation Age of Onset   Cancer Father        Lung   Cancer Mother        Colon   Hypertension Mother    Diabetes Other        Paternal side   Coronary artery disease Maternal Grandmother    Coronary artery disease Paternal Grandmother     Social History   Socioeconomic History   Marital status: Married    Spouse name: Not on file   Number of children: 3   Years of  education: Not on file   Highest education level: Not on file  Occupational History   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   Would not want tube feeds if cognitively unaware   Social Determinants of Health   Financial Resource Strain: Not on file  Food Insecurity:  Not on file  Transportation Needs: Not on file  Physical Activity: Not on file  Stress: Not on file  Social Connections: Not on file  Intimate Partner Violence: Not on file   Review of Systems Appetite is good Weight is fairly stable Sleeps well in general Wears seat belt Teeth okay---sees dentist every 3 months Rare heartburn--no recent meds. (Tums prn though). No dysphagia No suspicious skin lesions at present Chronic constipation--magnesium helps Did get injection in right shoulder--fine now. No other back or joint pains    Objective:   Physical Exam Constitutional:      Appearance: Normal appearance.  HENT:     Mouth/Throat:     Comments: No lesions Eyes:     Conjunctiva/sclera: Conjunctivae normal.     Pupils: Pupils are equal, round, and reactive to light.  Cardiovascular:     Rate and Rhythm: Normal rate and regular rhythm.     Pulses: Normal pulses.     Heart sounds: No murmur heard.   No gallop.  Pulmonary:     Effort: Pulmonary effort is normal.     Breath sounds: Normal breath sounds. No wheezing or rales.  Abdominal:     Palpations: Abdomen is soft.     Tenderness: There is no abdominal tenderness.  Musculoskeletal:     Cervical back: Neck supple.     Comments: Trace ankle edema--L>R  Lymphadenopathy:     Cervical: No cervical adenopathy.  Skin:    General: Skin is warm.     Findings: No rash.  Neurological:     Mental Status: She is alert and oriented to person, place, and time.     Comments: President--- "Biden, Trump, Obama" 100----"can't do numbers" D-l-r-o-w Recall 3/3  Psychiatric:        Mood and Affect: Mood normal.        Behavior: Behavior normal.           Assessment & Plan:

## 2021-04-12 NOTE — Progress Notes (Signed)
Hearing Screening  Method: Audiometry   '500Hz'$  '1000Hz'$  '2000Hz'$  '4000Hz'$   Right ear '20 20 20 20  '$ Left ear 0 0 20 40  Vision Screening - Comments:: December 2021

## 2021-04-12 NOTE — Assessment & Plan Note (Signed)
Episodic anxiety The xanax does help

## 2021-04-12 NOTE — Assessment & Plan Note (Signed)
Quiet with estrogen cream every other day and cranberry

## 2021-04-24 DIAGNOSIS — D1801 Hemangioma of skin and subcutaneous tissue: Secondary | ICD-10-CM | POA: Diagnosis not present

## 2021-04-24 DIAGNOSIS — L814 Other melanin hyperpigmentation: Secondary | ICD-10-CM | POA: Diagnosis not present

## 2021-04-24 DIAGNOSIS — L578 Other skin changes due to chronic exposure to nonionizing radiation: Secondary | ICD-10-CM | POA: Diagnosis not present

## 2021-04-24 DIAGNOSIS — L821 Other seborrheic keratosis: Secondary | ICD-10-CM | POA: Diagnosis not present

## 2021-04-26 NOTE — Progress Notes (Signed)
Cardiology Office Note  Date:  04/28/2021   ID:  Denise Rhodes, DOB July 22, 1941, MRN [id number]  PCP:  Venia Carbon, MD   Chief Complaint  Patient presents with   Follow-up    04-17-21 Patient c/o feeling lightheaded then fell. No loss of consciousness. Wanted to follow up with cardiology today for check up.     HPI:  Denise Rhodes is a 80 y.o. female w/ PMHx  anxiety, Insomnia IBS  palpitations 48 hour Holter monitor:  runs of atrial tachycardia, frequent APCs, bigeminal pattern. possible mitral valve prolapse Echocardiogram 2013, normal ejection fraction She presents today for follow-up of her palpitations  04/17/21: had just got up, Fell over dinning room table Etiology unclear Landed on the ground, minor injury No LOC BP was elevated Husband picked her up  Some anxiety/stress Since then, nervous getting up  Prior hx of near syncope Helps take care of grandchildren  EKG personally reviewed by myself on todays visit Normal sinus rhythm rate 68 bpm no significant ST or T wave changes  Other past medical history reviewed  Prior monitor showed runs of atrial tachycardia, frequent APCs, sometimes in a bigeminal pattern.   Previous echo indicated EF 57-32%, grade 1 diastolic dysfunction, structurally normal MV with normal leaflet separation, trivial MR.   PMH:   has a past medical history of Acute upper respiratory infections of unspecified site, Anxiety state, unspecified, Esophagitis (7/15), Gilbert's syndrome, H/O: pneumonia (09/2006), IBS (irritable bowel syndrome), Mitral valve disorders(424.0), Osteoporosis, unspecified, and Urinary tract infection, site not specified.  PSH:    Past Surgical History:  Procedure Laterality Date   Bladder tack     CATARACT EXTRACTION, BILATERAL  9/12, 1/13   MINOR EXCISION OF ORAL LESION     TOTAL ABDOMINAL HYSTERECTOMY W/ BILATERAL SALPINGOOPHORECTOMY  2000   Vaginectomy colpocleisis cysto  2/16    Current Outpatient  Medications  Medication Sig Dispense Refill   Alpha-Lipoic Acid 300 MG TABS Take 300 mg by mouth daily.     ALPRAZolam (XANAX) 0.25 MG tablet Take 1 tablet (0.25 mg total) by mouth 3 (three) times daily as needed. 30 tablet 0   Ascorbic Acid (VITAMIN C PO) Take by mouth.     Cranberry 400 MG CAPS Take by mouth.     CYANOCOBALAMIN PO Take by mouth daily.     estradiol (ESTRACE) 0.1 MG/GM vaginal cream Place 1 Applicatorful vaginally 3 (three) times a week. 42.5 g 12   Magnesium 250 MG TABS Take by mouth daily.      Pyridoxine HCl (VITAMIN B-6 PO) Take 600 mg by mouth daily. With NAC     VITAMIN D, CHOLECALCIFEROL, PO Take by mouth.     metoprolol succinate (TOPROL-XL) 25 MG 24 hr tablet Take 1 tablet (25 mg total) by mouth daily. 90 tablet 3   No current facility-administered medications for this visit.    Allergies:   Patient has no known allergies.   Social History:  The patient  reports that she has never smoked. She has never used smokeless tobacco. She reports that she does not drink alcohol and does not use drugs.   Family History:   family history includes Cancer in her father and mother; Coronary artery disease in her maternal grandmother and paternal grandmother; Diabetes in an other family member; Hypertension in her mother.    Review of Systems: Review of Systems  Constitutional: Negative.   Respiratory: Negative.    Cardiovascular: Negative.   Gastrointestinal: Negative.   Musculoskeletal:  Negative.   Neurological: Negative.   Psychiatric/Behavioral: Negative.    All other systems reviewed and are negative.   PHYSICAL EXAM: VS:  BP 124/70   Pulse 68   Ht 5\' 5"  (1.651 m)   Wt 154 lb (69.9 kg)   SpO2 98%   BMI 25.63 kg/m  , BMI Body mass index is 25.63 kg/m. Constitutional:  oriented to person, place, and time. No distress.  HENT:  Head: Grossly normal Eyes:  no discharge. No scleral icterus.  Neck: No JVD, no carotid bruits  Cardiovascular: Regular rate and  rhythm, no murmurs appreciated Pulmonary/Chest: Clear to auscultation bilaterally, no wheezes or rails Abdominal: Soft.  no distension.  no tenderness.  Musculoskeletal: Normal range of motion Neurological:  normal muscle tone. Coordination normal. No atrophy Skin: Skin warm and dry Psychiatric: normal affect, pleasant   Recent Labs: 04/12/2021: ALT 17; BUN 14; Creatinine, Ser 0.64; Hemoglobin 12.7; Platelets 208.0; Potassium 4.3; Sodium 141    Lipid Panel Lab Results  Component Value Date   CHOL 164 11/16/2014   HDL 61.40 11/16/2014   LDLCALC 88 11/16/2014   TRIG 71.0 11/16/2014      Wt Readings from Last 3 Encounters:  04/28/21 154 lb (69.9 kg)  04/12/21 151 lb (68.5 kg)  08/09/20 151 lb (68.5 kg)     ASSESSMENT AND PLAN:  Atrial tachycardia (HCC)  Doing well on metoprolol succinate 25 daily Denies breakthrough, no medication changes made  Fall Reports giving out, denies near syncope or syncope Possibly secondary to anxiety Recommend regular walking program for conditioning For current symptoms could order a ZIO monitor to exclude arrhythmia  Anxiety Recommended exercise program,  Stress reduction techniques discussed Xanax as needed  Essential hypertension Blood pressure is well controlled on today's visit. No changes made to the medications.   Total encounter time more than 25 minutes  Greater than 50% was spent in counseling and coordination of care with the patient    Orders Placed This Encounter  Procedures   EKG 12-Lead      Signed, Esmond Plants, M.D., Ph.D. 04/28/2021  [place], [place]

## 2021-04-28 ENCOUNTER — Ambulatory Visit (INDEPENDENT_AMBULATORY_CARE_PROVIDER_SITE_OTHER): Payer: Medicare HMO | Admitting: Cardiovascular Disease

## 2021-04-28 ENCOUNTER — Encounter: Payer: Self-pay | Admitting: Cardiovascular Disease

## 2021-04-28 ENCOUNTER — Other Ambulatory Visit: Payer: Self-pay

## 2021-04-28 VITALS — BP 124/70 | HR 68 | Ht 65.0 in | Wt 154.0 lb

## 2021-04-28 DIAGNOSIS — I059 Rheumatic mitral valve disease, unspecified: Secondary | ICD-10-CM | POA: Diagnosis not present

## 2021-04-28 DIAGNOSIS — R002 Palpitations: Secondary | ICD-10-CM

## 2021-04-28 DIAGNOSIS — I471 Supraventricular tachycardia: Secondary | ICD-10-CM

## 2021-04-28 MED ORDER — METOPROLOL SUCCINATE ER 25 MG PO TB24: 25.0000 mg | ORAL_TABLET | Freq: Every day | ORAL | 3 refills | Status: DC

## 2021-04-28 NOTE — Patient Instructions (Addendum)
Medication Instructions:  No changes  If you need a refill on your cardiac medications before your next appointment, please call your pharmacy.   Lab work: No new labs needed  Testing/Procedures: No new testing needed  Follow-Up: At [facility], you and your health needs are our priority.  As part of our continuing mission to provide you with exceptional heart care, we have created designated Provider Care Teams.  These Care Teams include your primary Cardiologist (physician) and Advanced Practice Providers (APPs -  Physician Assistants and Nurse Practitioners) who all work together to provide you with the care you need, when you need it.  You will need a follow up appointment in 12 months  Providers on your designated Care Team:   Christopher Berge, NP Ryan Dunn, PA-C Jacquelyn Visser, PA-C Cadence Furth, PA-C  COVID-19 Vaccine Information can be found at: [email or web address] For questions related to vaccine distribution or appointments, please email [email or web address] or call [phone].    

## 2021-05-04 ENCOUNTER — Telehealth: Payer: Self-pay | Admitting: Cardiovascular Disease

## 2021-05-04 NOTE — Telephone Encounter (Signed)
Spoke with patients husband and he stated that the patient had an episode yesterday of feeling very dizzy and lightheaded. He checked her BP at that time and it was 172/91. He was not sure if she was dehydrated or having some anxiety. He did give her a Xanax and she felt a lot better. Today she has felt fine and her BP 138/80.  I informed him to make sure she is drinking enough liquids throughout the day, and to make sure she is making slow postural changes. I informed him that we would send this information to Dr. Rockey Situ and follow up with any further recommendations.

## 2021-05-04 NOTE — Telephone Encounter (Signed)
STAT if patient feels like he/she is going to faint   Are you dizzy now? Not now, last night   Do you feel faint or have you passed out? Felt faint - had to sit down  Do you have any other symptoms? lightheadedness  Have you checked your HR and BP (record if available)? BP has been high - today it is stable

## 2021-05-08 NOTE — Telephone Encounter (Signed)
Able to reach pt's husband Delfino Lovett (DPR approved) to f/u regarding Mrs. Schertzer lightheadedness/dizziness experience last week.  Per Dr. Lise Auer, he is suggesting Could consider a zio monitor to r/u arrhythmia  Continue walking program, hydration  Thx  TGollan   Richard reports wife spell is more anxiety driven, once she takes her Xanax she feels much better, symptoms resolves and her BP returns to baseline. Richard reports he has taken her pulse during this episodes of "panic or anxiety attacks" and has not noticed any irregularly in her pulse. At this time, he is deferring the ZIO monitor as he feels this is not cardiac related.  Richard to reach out to wife's PCP regarding Mrs. McLean's flares with her anxiety and possible need for refill on her Xanax. Otherwise all questions or concerns were address and no additional concerns at this time. Agreeable to plan, will call back for anything further.

## 2021-05-09 ENCOUNTER — Telehealth: Payer: Self-pay

## 2021-05-09 NOTE — Telephone Encounter (Signed)
Left detailed message on verified VM for husband.

## 2021-05-09 NOTE — Telephone Encounter (Signed)
to Dr Silvio Pate   I am not quite sure whats going on with [facility]. Most of the time she is ok however about 2 weeks ago she got lightheaded and fell to the floor I was near by and she wasn't hurt she never lost conscious since that time she has said that she has had that light headed feeling mostly in the morning right before breakfast. i took her to Dr Candis Musa he did a EKG and said it was perfect.Elycia a number of years ago experience some panic attacks even fainted once, however that was more then 32 years ago. I have noticed as she has got older she seems to have more problems with Anxiety.I dont know if this has any thing to do with it. Should I make a appointment and have you talk to her? I appreciate any thing you might be able to add. Thanks Dr Silvio Pate

## 2021-05-24 ENCOUNTER — Encounter: Payer: Self-pay | Admitting: Internal Medicine

## 2021-05-24 ENCOUNTER — Other Ambulatory Visit: Payer: Self-pay

## 2021-05-24 ENCOUNTER — Ambulatory Visit (INDEPENDENT_AMBULATORY_CARE_PROVIDER_SITE_OTHER): Payer: Medicare HMO | Admitting: Internal Medicine

## 2021-05-24 DIAGNOSIS — F39 Unspecified mood [affective] disorder: Secondary | ICD-10-CM | POA: Diagnosis not present

## 2021-05-24 DIAGNOSIS — R42 Dizziness and giddiness: Secondary | ICD-10-CM | POA: Diagnosis not present

## 2021-05-24 DIAGNOSIS — R69 Illness, unspecified: Secondary | ICD-10-CM | POA: Diagnosis not present

## 2021-05-24 DIAGNOSIS — I471 Supraventricular tachycardia: Secondary | ICD-10-CM | POA: Diagnosis not present

## 2021-05-24 NOTE — Assessment & Plan Note (Signed)
Has both vestibular type dizziness----that goes back many years The more current symptoms are different---and likely are postural and may be exacerbated by her need for the metoprolol (to control atrial tachycardia) Discussed all this---no medication appropriate---just awareness and lying down prn

## 2021-05-24 NOTE — Progress Notes (Signed)
Subjective:    Patient ID: Denise Rhodes, female    DOB: Oct 14, 1940, 80 y.o.   MRN: [id number]  HPI Here due to dizziness--with husband This visit occurred during the SARS-CoV-2 public health emergency.  Safety protocols were in place, including screening questions prior to the visit, additional usage of staff PPE, and extensive cleaning of exam room while observing appropriate contact time as indicated for disinfecting solutions.   Had spell on 9/11 Reached across the table and she felt bad and fell to the floor (but didn't lose consciousness) Did see cardiology after that Has brief sensation since then--will just lie down and it will pass (tends to get panicky when she feels this way--but not necessarily caused by panic type feelings)  Recent visit from daughter who is PT Observed that she her dizziness while standing in place for too long Also reviewed that she gets some dizziness if she turns her head quickly (like trying to watch grandkids running around her). Gets sense of balance being off--but this is not new  Has been on the metoprolol for some time Really helped the severe palpitations (from documented atrial tachycardia)  Current Outpatient Medications on File Prior to Visit  Medication Sig Dispense Refill   Acetylcarnitine HCl (ACETYL L-CARNITINE) 500 MG CAPS Take by mouth.     Alpha-Lipoic Acid 300 MG TABS Take 300 mg by mouth daily.     ALPRAZolam (XANAX) 0.25 MG tablet Take 1 tablet (0.25 mg total) by mouth 3 (three) times daily as needed. 30 tablet 0   Ascorbic Acid (VITAMIN C PO) Take by mouth.     Cranberry 400 MG CAPS Take by mouth.     CYANOCOBALAMIN PO Take by mouth daily.     estradiol (ESTRACE) 0.1 MG/GM vaginal cream Place 1 Applicatorful vaginally 3 (three) times a week. 42.5 g 12   Magnesium 250 MG TABS Take by mouth daily.      metoprolol succinate (TOPROL-XL) 25 MG 24 hr tablet Take 1 tablet (25 mg total) by mouth daily. 90 tablet 3   Pyridoxine HCl  (VITAMIN B-6 PO) Take 600 mg by mouth daily. With NAC     VITAMIN D, CHOLECALCIFEROL, PO Take by mouth.     No current facility-administered medications on file prior to visit.    No Known Allergies  Past Medical History:  Diagnosis Date   Acute upper respiratory infections of unspecified site    Anxiety state, unspecified    Esophagitis 7/15   and gastritis on EGD-- Dr Gustavo Lah   Gilbert's syndrome    H/O: pneumonia 09/2006   IBS (irritable bowel syndrome)    Mitral valve disorders(424.0)    Osteoporosis, unspecified    Urinary tract infection, site not specified     Past Surgical History:  Procedure Laterality Date   Bladder tack     CATARACT EXTRACTION, BILATERAL  9/12, 1/13   MINOR EXCISION OF ORAL LESION     TOTAL ABDOMINAL HYSTERECTOMY W/ BILATERAL SALPINGOOPHORECTOMY  2000   Vaginectomy colpocleisis cysto  2/16    Family History  Problem Relation Age of Onset   Cancer Father        Lung   Cancer Mother        Colon   Hypertension Mother    Diabetes Other        Paternal side   Coronary artery disease Maternal Grandmother    Coronary artery disease Paternal Grandmother     Social History   Socioeconomic History  Marital status: Married    Spouse name: Not on file   Number of children: 3   Years of education: Not on file   Highest education level: Not on file  Occupational History   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   Would not want tube feeds if cognitively unaware   Social Determinants of Health    Financial Resource Strain: Not on file  Food Insecurity: Not on file  Transportation Needs: Not on file  Physical Activity: Not on file  Stress: Not on file  Social Connections: Not on file  Intimate Partner Violence: Not on file   Review of Systems Sleeping well Appetite is good Feels well in general     Objective:   Physical Exam Constitutional:      Appearance: Normal appearance.  Cardiovascular:     Rate and Rhythm: Normal rate and regular rhythm.     Heart sounds: No murmur heard.   No gallop.  Pulmonary:     Effort: Pulmonary effort is normal.     Breath sounds: Normal breath sounds. No wheezing or rales.  Musculoskeletal:     Cervical back: Neck supple.     Right lower leg: No edema.     Left lower leg: No edema.  Lymphadenopathy:     Cervical: No cervical adenopathy.  Neurological:     Mental Status: She is alert.  Psychiatric:        Mood and Affect: Mood normal.        Behavior: Behavior normal.           Assessment & Plan:

## 2021-05-24 NOTE — Assessment & Plan Note (Signed)
Has long standing panic type symptoms intermittently I don't think the panic is the cause of her dizziness Still gets relief from the xanax prn

## 2021-05-24 NOTE — Assessment & Plan Note (Signed)
Documented spells on monitor She does get palpitations so it is unlikely that recurrences are causing her spells Given the relief of symptoms with the metoprolol, I think she should continue (though it may affect her postural dizziness)

## 2021-07-07 DIAGNOSIS — H43813 Vitreous degeneration, bilateral: Secondary | ICD-10-CM | POA: Diagnosis not present

## 2021-08-25 ENCOUNTER — Other Ambulatory Visit: Payer: Self-pay | Admitting: Internal Medicine

## 2021-08-31 ENCOUNTER — Other Ambulatory Visit: Payer: Self-pay | Admitting: Internal Medicine

## 2021-08-31 ENCOUNTER — Telehealth: Payer: Self-pay | Admitting: Internal Medicine

## 2021-08-31 MED ORDER — ACYCLOVIR 5 % EX CREA: 1.0000 "application " | TOPICAL_CREAM | CUTANEOUS | 3 refills | Status: DC

## 2021-08-31 NOTE — Telephone Encounter (Signed)
It was last on her chart in 2020. I denied the refill asking them to contact the office if she needed it. Will forward to Dr Silvio Pate to approve or deny.

## 2021-08-31 NOTE — Telephone Encounter (Signed)
Pt husband called asking if he could get medication acyclovir ointment (ZOVIRAX) 5 % [Pharmacy Med , or something like it called in for pt. Please advise.

## 2021-08-31 NOTE — Telephone Encounter (Signed)
Please let her know I sent it to her [facility]--but the computer says it isn't covered

## 2021-08-31 NOTE — Telephone Encounter (Signed)
Spoke to pt's husband. Suggested he use a GoodRx card at [facility] [employer].

## 2021-09-05 ENCOUNTER — Other Ambulatory Visit: Payer: Self-pay | Admitting: Internal Medicine

## 2021-09-05 NOTE — Telephone Encounter (Signed)
Pharmacy comment: this isn't covered on insurance. it is $2,291.10. please send in for tablets. thanks.  I looked at [facility] and 15g tube of ointment at [facility] is $25.

## 2021-10-11 ENCOUNTER — Encounter: Payer: Self-pay | Admitting: Emergency Medicine

## 2021-10-11 ENCOUNTER — Other Ambulatory Visit: Payer: Self-pay

## 2021-10-11 ENCOUNTER — Emergency Department
Admission: EM | Admit: 2021-10-11 | Discharge: 2021-10-11 | Disposition: A | Discharge status: Home / Self Care | Payer: Medicare HMO | Attending: Emergency Medicine | Admitting: Emergency Medicine

## 2021-10-11 ENCOUNTER — Telehealth: Payer: Self-pay | Admitting: Internal Medicine

## 2021-10-11 DIAGNOSIS — M5441 Lumbago with sciatica, right side: Secondary | ICD-10-CM | POA: Diagnosis not present

## 2021-10-11 DIAGNOSIS — M545 Low back pain, unspecified: Secondary | ICD-10-CM | POA: Diagnosis present

## 2021-10-11 MED ORDER — LIDOCAINE 5 % EX PTCH
1.0000 | MEDICATED_PATCH | CUTANEOUS | Status: DC
  Administered 2021-10-11: 1 via TRANSDERMAL
  Filled 2021-10-11: qty 1

## 2021-10-11 MED ORDER — OXYCODONE-ACETAMINOPHEN 5-325 MG PO TABS
1.0000 | ORAL_TABLET | Freq: Once | ORAL | Status: AC
  Administered 2021-10-11: 1 via ORAL
  Filled 2021-10-11: qty 1

## 2021-10-11 MED ORDER — OXYCODONE HCL 5 MG PO TABS: 5.0000 mg | ORAL_TABLET | Freq: Three times a day (TID) | ORAL | 0 refills | Status: AC | PRN

## 2021-10-11 MED ORDER — CYCLOBENZAPRINE HCL 10 MG PO TABS
5.0000 mg | ORAL_TABLET | Freq: Once | ORAL | Status: AC
  Administered 2021-10-11: 5 mg via ORAL
  Filled 2021-10-11: qty 1

## 2021-10-11 NOTE — Telephone Encounter (Signed)
Patients husband has called stating the pt went to the ED but all if the medications discussed were not called in. Pt is missing the muscle relaxer prescription that was discussed while being there and they are wondering if the Doctor could call that in. ? ?Please advise  ?

## 2021-10-11 NOTE — ED Provider Notes (Signed)
? ?[facility] ?Provider Note ? ? ? Event Date/Time  ? First MD Initiated Contact with Patient 10/11/21 0245   ?  (approximate) ? ? ?History  ? ?Back Pain ? ? ?HPI ? ?Denise Rhodes is a 81 y.o. female with past medical history of Gill Bears syndrome, IBS, anxiety, osteoporosis who presents with back pain.  Symptoms of been going on for about 1 week.  She notes that she was carrying heavy groceries but otherwise denies any preceding trauma.  Pain is located in the right lower back rating down to the right buttock.  Over her buttock radiates down to the mid buttock.  She denies any numbness tingling weakness.  No saddle anesthesia.  Denies urinary bowel incontinence.  No dysuria.  Pain has been relatively controlled throughout the week but then tonight when she got up to go to the bathroom it was more severe and she was having difficulty ambulating.  She took to 400 mg ibuprofen prior to arrival which is taken the edge off somewhat but she continues to have pain.  Similar many years ago but denies any chronic back pain.  No fevers no history immunocompromise or cancer. ? ?Past Medical History:  ?Diagnosis Date  ? Acute upper respiratory infections of unspecified site   ? Anxiety state, unspecified   ? Esophagitis 7/15  ? and gastritis on EGD-- Dr Gustavo Lah  ? Gilbert's syndrome   ? H/O: pneumonia 09/2006  ? IBS (irritable bowel syndrome)   ? Mitral valve disorders(424.0)   ? Osteoporosis, unspecified   ? Urinary tract infection, site not specified   ? ? ?Patient Active Problem List  ? Diagnosis Date Noted  ? Dizziness 05/24/2021  ? Recurrent UTI 03/20/2017  ? GERD (gastroesophageal reflux disease) 11/26/2016  ? Advance directive discussed with patient 11/16/2014  ? Atrial tachycardia ([place]) 06/10/2013  ? Routine general medical examination at a health care facility 11/21/2011  ? IBS (irritable bowel syndrome)   ? Episodic mood disorder ([place]) 03/20/2007  ? Mitral valve disorder 03/20/2007  ?  Osteoporosis 03/20/2007  ? ? ? ?Physical Exam  ?Triage Vital Signs: ?ED Triage Vitals  ?Enc Vitals Group  ?   BP 10/11/21 0239 (!) 175/97  ?   Pulse Rate 10/11/21 0239 65  ?   Resp 10/11/21 0239 18  ?   Temp 10/11/21 0239 98.3 ?F (36.8 ?C)  ?   Temp Source 10/11/21 0239 Oral  ?   SpO2 10/11/21 0239 98 %  ?   Weight 10/11/21 0237 150 lb (68 kg)  ?   Height 10/11/21 0237 '5\' 4"'$  (1.626 m)  ?   Head Circumference --   ?   Peak Flow --   ?   Pain Score 10/11/21 0237 10  ?   Pain Loc --   ?   Pain Edu? --   ?   Excl. in [place]? --   ? ? ?Most recent vital signs: ?Vitals:  ? 10/11/21 0330 10/11/21 0400  ?BP: (!) 155/87 (!) 163/78  ?Pulse: 66 67  ?Resp:  18  ?Temp:    ?SpO2: 94% 98%  ? ? ? ?General: Awake, no distress.  ?CV:  Good peripheral perfusion.  ?Resp:  Normal effort.  ?Abd:  No distention.  ?Neuro:             Awake, Alert, Oriented x 3  ?Other:  No midline lumbar tenderness, tender to palpation of the right SI joint ?5/5 strength with plantarflexion dorsiflexion, left hip  extension, difficulty testing right hip extension strength secondary to pain ? ? ?ED Results / Procedures / Treatments  ?Labs ?(all labs ordered are listed, but only abnormal results are displayed) ?Labs Reviewed - No data to display ? ? ?EKG ? ? ? ? ?RADIOLOGY ? ? ? ?PROCEDURES: ? ?Critical Care performed: No ? ?Procedures ? ?The patient is on the cardiac monitor to evaluate for evidence of arrhythmia and/or significant heart rate changes. ? ? ?MEDICATIONS ORDERED IN ED: ?Medications  ?lidocaine (LIDODERM) 5 % 1 patch (1 patch Transdermal Patch Applied 10/11/21 0312)  ?oxyCODONE-acetaminophen (PERCOCET/ROXICET) 5-325 MG per tablet 1 tablet (1 tablet Oral Given 10/11/21 0312)  ?cyclobenzaprine (FLEXERIL) tablet 5 mg (5 mg Oral Given 10/11/21 0404)  ? ? ? ?IMPRESSION / MDM / ASSESSMENT AND PLAN / ED COURSE  ?I reviewed the triage vital signs and the nursing notes. ?             ?               ? ?Differential diagnosis includes, but is not limited to,  sciatica, herniated disc, lumbar strain, less likely cauda equina syndrome, spinal epidural abscess ? ?The patient is a 81 year old female presenting with back pain.  Symptoms for about 1 week worsening tonight.  Pain is located in the right lower back in the area of the SI joint rating down to the right buttock.  She has no red flag symptoms including fever, history of cancer immunocompromise and no numbness tingling weakness saddle anesthesia bowel or bladder symptoms.  She does appear somewhat uncomfortable on exam.  She has no midline lumbar tenderness.  Her tenderness is primarily over the SI joint.  She has full strength with plantarflexion dorsiflexion hip strength with extension of the right side somewhat limited due to pain.  She has already taken ibuprofen at the time of ED visit.  Will give Percocet and attempt to control pain.  Low suspicion for cord compression or other serious pathology.  Suspect sciatica.  Consider more serious pathology such as AAA dissection or kidney stone specially given her age however with the localized pain primarily in the SI region on reproducible on palpation especially worse with movement I think this is mostly musculoskeletal. ? ?After Percocet and Flexeril patient significant improved.  She is able to ambulate without difficulty.  I think she is appropriate discharge.  We will send a short course of oxycodone for breakthrough pain.  Recommended NSAIDs.  We discussed return precautions for signs of cauda equina.  She will follow-up with her PCP. ? ?Clinical Course as of 10/11/21 0443  ?Wed Oct 11, 2021  ?0303 Temp: 98.3 ?F (36.8 ?C) [KM]  ?  ?Clinical Course User Index ?[KM] Rada Hay, MD  ? ? ? ?FINAL CLINICAL IMPRESSION(S) / ED DIAGNOSES  ? ?Final diagnoses:  ?Acute right-sided low back pain with right-sided sciatica  ? ? ? ?Rx / DC Orders  ? ?ED Discharge Orders   ? ?      Ordered  ?  oxyCODONE (ROXICODONE) 5 MG immediate release tablet  Every 8 hours PRN        ? 10/11/21 0434  ? ?  ?  ? ?  ? ? ? ?Note:  This document was prepared using Dragon voice recognition software and may include unintentional dictation errors. ?  ?Rada Hay, MD ?10/11/21 [phone] ? ?

## 2021-10-11 NOTE — ED Triage Notes (Signed)
Pt arrived via POV with reports of R flank pain radiating down to R buttocks, pt has been having pain for the past several days, worse tonight. Pt reports taking ibuprofen around 1am, which provided some relief. ? ?Pt unable to find position that is comfortable. ? ?Pt states the pain feels similar to muscle pain she had before. ? ?Pt does not have hx of kidney stones. ?

## 2021-10-12 DIAGNOSIS — M47816 Spondylosis without myelopathy or radiculopathy, lumbar region: Secondary | ICD-10-CM | POA: Diagnosis not present

## 2021-10-12 DIAGNOSIS — M545 Low back pain, unspecified: Secondary | ICD-10-CM | POA: Diagnosis not present

## 2021-10-12 DIAGNOSIS — M47896 Other spondylosis, lumbar region: Secondary | ICD-10-CM | POA: Diagnosis not present

## 2021-10-12 DIAGNOSIS — S39012A Strain of muscle, fascia and tendon of lower back, initial encounter: Secondary | ICD-10-CM | POA: Diagnosis not present

## 2021-10-12 NOTE — Telephone Encounter (Signed)
Spoke to pt's husband. The ER Note does not say she would be given Flexeril. She is going to [facility]. ?

## 2021-10-17 DIAGNOSIS — M545 Low back pain, unspecified: Secondary | ICD-10-CM | POA: Diagnosis not present

## 2021-10-18 ENCOUNTER — Ambulatory Visit: Payer: Medicare HMO | Admitting: Family

## 2021-10-18 ENCOUNTER — Telehealth: Payer: Self-pay

## 2021-10-18 NOTE — Telephone Encounter (Signed)
I spoke with pt and pt thought the 10/18/21 appt with T Dugal FNP at 11:40 was already cancelled. Pt said she has been drinking a lot of water because pt thought she was dehydrated and pt said the rt side abd pain just went away. Pt has not run fever. Pt has not had BM in few days due to being on oxycodone. Pt is going to take med to relieve no BM. Pt had an MRI on 10/17/21 and has appt at [facility] in [place] on 10/20/21 at 9 AM for MRI results. Pt plans on keeping ED FU with Dr Silvio Pate on 10/20/21 at 10:15. UC & ED precautions given and pt voiced understanding.Sending note to Red Christians FNP and Dr Silvio Pate as PCP. ?

## 2021-10-18 NOTE — Telephone Encounter (Signed)
Okay ?We can review all at the appt on Friday ?

## 2021-10-20 ENCOUNTER — Ambulatory Visit (INDEPENDENT_AMBULATORY_CARE_PROVIDER_SITE_OTHER): Payer: Medicare HMO | Admitting: Internal Medicine

## 2021-10-20 ENCOUNTER — Encounter: Payer: Self-pay | Admitting: Internal Medicine

## 2021-10-20 ENCOUNTER — Other Ambulatory Visit: Payer: Self-pay

## 2021-10-20 DIAGNOSIS — S335XXA Sprain of ligaments of lumbar spine, initial encounter: Secondary | ICD-10-CM | POA: Diagnosis not present

## 2021-10-20 DIAGNOSIS — M5416 Radiculopathy, lumbar region: Secondary | ICD-10-CM | POA: Diagnosis not present

## 2021-10-20 NOTE — Progress Notes (Signed)
? ?Subjective:  ? ? Patient ID: Denise Rhodes, female    DOB: Jul 19, 1941, 81 y.o.   MRN: [id number] ? ?HPI ?Here for ER follow up--with husband ? ?Seen in ER on March 8th ?Did lift heavy groceries that day--but not clearly an injury (and did mop floors) ?Got intense pain ~2AM and husband rushed her to the hospital ?She improved while there and got some oxycodone ?Told to follow with me ? ?Due to ongoing issues--she went to [facility] the next day ?Saw PA---told she has spine arthritis on x-ray ?Started on steroid dosepak x 5 days---no help ?Intermittent oxycodone--got #15 ?Saw MD today in follow up---felt this should be self limited ? ?Has tried some ibuprofen in the past couple of days---actually helps more ?Used 2 tabs up to tid ? ?No radiation of pain into leg ?No leg weakness ? ?Current Outpatient Medications on File Prior to Visit  ?Medication Sig Dispense Refill  ? Acetylcarnitine HCl (ACETYL L-CARNITINE) 500 MG CAPS Take by mouth.    ? Alpha-Lipoic Acid 300 MG TABS Take 300 mg by mouth daily.    ? ALPRAZolam (XANAX) 0.25 MG tablet Take 1 tablet (0.25 mg total) by mouth 3 (three) times daily as needed. 30 tablet 0  ? Ascorbic Acid (VITAMIN C PO) Take by mouth.    ? Cranberry 400 MG CAPS Take by mouth.    ? estradiol (ESTRACE) 0.1 MG/GM vaginal cream Place 1 Applicatorful vaginally 3 (three) times a week. 42.5 g 12  ? Magnesium 250 MG TABS Take by mouth daily.     ? metoprolol succinate (TOPROL-XL) 25 MG 24 hr tablet Take 1 tablet (25 mg total) by mouth daily. 90 tablet 3  ? Pyridoxine HCl (VITAMIN B-6 PO) Take 600 mg by mouth daily. With NAC    ? VITAMIN D, CHOLECALCIFEROL, PO Take by mouth.    ? acyclovir cream (ZOVIRAX) 5 % Apply 1 application topically every 3 (three) hours. (Patient not taking: Reported on 10/20/2021) 15 g 3  ? CYANOCOBALAMIN PO Take by mouth daily. (Patient not taking: Reported on 10/20/2021)    ? ?No current facility-administered medications on file prior to visit.  ? ? ?No Known  Allergies ? ?Past Medical History:  ?Diagnosis Date  ? Acute upper respiratory infections of unspecified site   ? Anxiety state, unspecified   ? Esophagitis 7/15  ? and gastritis on EGD-- Dr Gustavo Lah  ? Gilbert's syndrome   ? H/O: pneumonia 09/2006  ? IBS (irritable bowel syndrome)   ? Mitral valve disorders(424.0)   ? Osteoporosis, unspecified   ? Urinary tract infection, site not specified   ? ? ?Past Surgical History:  ?Procedure Laterality Date  ? Bladder tack    ? CATARACT EXTRACTION, BILATERAL  9/12, 1/13  ? MINOR EXCISION OF ORAL LESION    ? TOTAL ABDOMINAL HYSTERECTOMY W/ BILATERAL SALPINGOOPHORECTOMY  2000  ? Vaginectomy colpocleisis cysto  2/16  ? ? ?Family History  ?Problem Relation Age of Onset  ? Cancer Father   ?     Lung  ? Cancer Mother   ?     Colon  ? Hypertension Mother   ? Diabetes Other   ?     Paternal side  ? Coronary artery disease Maternal Grandmother   ? Coronary artery disease Paternal Grandmother   ? ? ?Social History  ? ?Socioeconomic History  ? Marital status: Married  ?  Spouse name: Not on file  ? Number of children: 3  ? Years of  education: Not on file  ? Highest education level: Not on file  ?Occupational History  ? 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 Smoking status: Never  ? Smokeless tobacco: Never  ?Vaping Use  ? Vaping Use: Never used  ?Substance and Sexual Activity  ? Alcohol use: No  ?  Alcohol/week: 0.0 standard drinks  ?  Comment: Rare  ? Drug use: No  ? Sexual activity: Not on file  ?Other Topics Concern  ? Not on file  ?Social History Narrative  ? Has living will   ? Husband is health care POA--- alternate is children   ? Would accept resuscitation attempts.  ? Would not want tube feeds if cognitively unaware  ? ?Social Determinants of Health  ? ?Financial Resource Strain: Not on file  ?Food Insecurity: Not on file  ?Transportation Needs: Not on file  ?Physical Activity: Not on file  ?Stress: Not on file  ?Social Connections: Not on file   ?Intimate Partner Violence: Not on file  ? ?Review of Systems ?No abnormal sensation in perineum ?No loss of bladder/bowel control ?   ?Objective:  ? Physical Exam ?Constitutional:   ?   Appearance: Normal appearance.  ?Musculoskeletal:  ?   Comments: Mild right paraspinal lumbar tenderness ?Back flexion fairly normal ?SLR negative--but stiff on left ?Normal ROM in hips  ?Neurological:  ?   Mental Status: She is alert.  ?   Comments: Normal gait ?No focal muscle weakness--but both quads are not very strong  ?  ? ? ? ? ?   ?Assessment & Plan:  ? ?

## 2021-10-20 NOTE — Assessment & Plan Note (Signed)
No radiculopathy ?Radiologic studies reassuring ?Discussed ibuprofen 400 tid with meals for a week or 2 ?Ortho is referring to PT ?Did see chiropractor as well ?

## 2021-10-25 ENCOUNTER — Ambulatory Visit: Payer: Medicare HMO | Attending: Orthopedic Surgery

## 2021-10-25 ENCOUNTER — Other Ambulatory Visit: Payer: Self-pay

## 2021-10-25 DIAGNOSIS — M25551 Pain in right hip: Secondary | ICD-10-CM | POA: Insufficient documentation

## 2021-10-25 DIAGNOSIS — M62838 Other muscle spasm: Secondary | ICD-10-CM | POA: Diagnosis not present

## 2021-10-25 NOTE — Therapy (Signed)
[facility] ?[address] ?[place], [place], [address] ?Phone: [phone]   Fax:  [phone] ? ?Physical Therapy Evaluation ? ?Patient Details  ?Name: Denise Rhodes ?MRN: [id number] ?Date of Birth: 1940/09/29 ?No data recorded ? ?Encounter Date: 10/25/2021 ? ? PT End of Session - 10/25/21 1515   ? ? Visit Number 1   ? Number of Visits 13   ? Date for PT Re-Evaluation 12/06/21   ? PT Start Time 0202   ? PT Stop Time 0250   ? PT Time Calculation (min) 48 min   ? Activity Tolerance Patient tolerated treatment well   ? Behavior During Therapy [facility] for tasks assessed/performed   ? ?  ?  ? ?  ? ? ?Past Medical History:  ?Diagnosis Date  ? Acute upper respiratory infections of unspecified site   ? Anxiety state, unspecified   ? Esophagitis 7/15  ? and gastritis on EGD-- Dr Gustavo Lah  ? Gilbert's syndrome   ? H/O: pneumonia 09/2006  ? IBS (irritable bowel syndrome)   ? Mitral valve disorders(424.0)   ? Osteoporosis, unspecified   ? Urinary tract infection, site not specified   ? ? ?Past Surgical History:  ?Procedure Laterality Date  ? Bladder tack    ? CATARACT EXTRACTION, BILATERAL  9/12, 1/13  ? MINOR EXCISION OF ORAL LESION    ? TOTAL ABDOMINAL HYSTERECTOMY W/ BILATERAL SALPINGOOPHORECTOMY  2000  ? Vaginectomy colpocleisis cysto  2/16  ? ? ?There were no vitals filed for this visit. ? ? ? Subjective Assessment - 10/25/21 1358   ? ? Subjective Pt is a 81 y.o. female referred to OP PT for LBP after an ED visit on 10/11/21 which MD's seem to believe is a musculoskeletal injury. PMH includes: Gill Bears Syndrome, IBS, anxiety, osteoporosis.   ? Pertinent History Pt is a 81 y.o. female referred to OP PT for LBP after an ED visit on 10/11/21 which MD's seem to believe is a musculoskeletal injury. PMH includes: Gill Bears Syndrome, IBS, anxiety, osteoporosis. Pt reports onset of LBP on March 8th early in hte morning which she attributes to cleaning the day prior that may  have caused her pain. Reports pain slowly improving, its worst in the morning that improves with time and movement but worsening of pain in evenings after activit. Initially pain described at a 10/10 described as a muscle spasm. Now pain currently described as soreness, from R sided low back into hip that is constant ache. Heat helps relax more so than ice pack. Current worst pain since ED visit has been a 4-5/10 in mornings, 2-3/10 is best. Currently no LBP. Activities that worsen pain: Pt has a hard time describing painful movements besides bed mobility. Pt denies B/B changes, saddle anesthesia, fever, signifiacnt weight loss. Pt's major goal is to improve pain and prevent future issues and back pain.   ? Currently in Pain? No/denies   ? ?  ?  ? ?  ? ?OBJECTIVE ? ?Mental Status ? ?Patient's fund of knowledge is within normal limits for educational level. ? ? ?Gross Musculoskeletal Assessment ?Tremor: None ?Bulk: Normal ?Tone: Normal ?No visible step-off along spinal column ? ? ?Gait ?Slight antalgic gait on RLE with step through pattern ? ? ?Posture ?Lumbar lordosis: WNL ?Iliac crest height: equal bilaterally ?Lumbar lateral shift: negative ? ? ?AROM (degrees) ?R/L (all movements include overpressure unless otherwise stated) ?Lumbar forward flexion (65): limited ~50% ?Lumbar extension (30): WNL ?Lumbar lateral flexion (  25): WNL bilat ?Thoracic and Lumbar rotation (30 degrees):WNL bilat ?Hip IR (0-45): NT ?Hip ER (0-45):NT ?Hip Flexion (0-125): ~110 deg bilat ?*Indicates pain ? ? ?PROM (degrees) ?PROM > AROM with hip flexion, IR, ER ? ? ?Strength (out of 5) ?R/L ?4/4 Hip flexion ?5/5 Hip ER ?5/5 Hip IR ?4/4 Hip abduction (seated) ?5/5 Hip adduction ?5/5 Knee extension ?4/4 Knee flexion ?5/5 Ankle dorsiflexion ?5/5 Ankle plantarflexion ?*Indicates pain ? ? ?Sensation ?Grossly intact to light touch bilateral LEs as determined by testing dermatomes L2-S2. Proprioception and hot/cold testing deferred on this  date. ? ? ?Reflexes ?R/L ?2+/2+ Knee Jerk (L3/4) ?2+/2+ Ankle Jerk (S1/2) ? ? ?Palpation ?Graded on 0-4 scale (0 = no pain, 1 = pain, 2 = pain with wincing/grimacing/flinching, 3 = pain with withdrawal, 4 = unwilling to allow palpation) ?Location LEFT  RIGHT           ?Lumbar paraspinals 0 0  ?Quadratus Lumborum 0 0  ?Gluteus Maximus 0 0  ?Gluteus Medius 0 0  ?Deep hip external rotators 0 2  ?PSIS 0 0  ?Fortin's Area (SIJ) 0 0  ?Greater Trochanter 0 0  ?  ? ?Muscle Length ?Hamstrings: NT ?[place]: positive bilat with hip flexion with end range knee flexion ? ? ?Passive Accessory Intervertebral Motion (PAIVM) ?Pt denies reproduction of back pain with CPA L1-L5 and UPA bilaterally L1-L5. Generally normal mobility throughout ? ?  ?SPECIAL TESTS ?Lumbar Radiculopathy and Discogenic: ?Centralization and Peripheralization (SN 92, -LR 0.12): Not examined ?Slump (SN 83, -LR 0.32): R: Negative L: Negative ?SLR (SN 92, -LR 0.29): R: Negative L:  Negative ?Crossed SLR (SP 90): R: Negative L: Negative ? ? ?Hip: ?FABER (SN 81): R: Positive L: Negative ?FADIR (SN 94): R: Negative L: Negative ?Hip scour (SN 50): R: Not examined L: Not examined ? ? ?SIJ:  ?Thigh Thrust (SN 88, -LR 0.18) : R: Negative L: Negative ? ? ?Piriformis Syndrome: ?FAIR Test (SN 88, SP 83): R: Not done L: Not done ? ? ? ?Objective measurements completed on examination: See above findings.  ? ? PT Education - 10/25/21 1358   ? ? Education Details POC, HEP   ? Person(s) Educated Patient   ? Methods Explanation;Handout   ? Comprehension Verbalized understanding;Need further instruction   ? ?  ?  ? ?  ? ? ? PT Short Term Goals - 10/25/21 1524   ? ?  ? PT SHORT TERM GOAL #1  ? Title pt will be independent with HEP to improve hip flexibility, pain, and strength to return to PLOF   ? Baseline 10/25/21: initiated   ? Time 3   ? Period Weeks   ? Status New   ? Target Date 11/08/21   ? ?  ?  ? ?  ? ? ? ? PT Long Term Goals - 10/25/21 1524   ? ?  ? PT LONG TERM GOAL #1  ?  Title Pt will improve FOTO to target score or greater to demo clinically significant improvement in functional mobility   ? Baseline 10/25/21: 54 with target score of 69   ? Time 6   ? Period Weeks   ? Status New   ? Target Date 12/06/21   ?  ? PT LONG TERM GOAL #2  ? Title Pt will improve 5xSTS by at least 3 seconds to indicate clinically significant improvement in LE strength   ? Baseline 10/25/21: Next session   ? Time 6   ? Period Weeks   ?  Status New   ? Target Date 12/06/21   ?  ? PT LONG TERM GOAL #3  ? Title Pt will demonstrate safe body mechanics and ergonomics with lifting, squatting, and push/pull ADL's to prevent risk of future MSK injury   ? Baseline 10/25/21: Needs f/u assessment on body mechanics and ergonomics in POC   ? Time 6   ? Period Weeks   ? Status New   ? Target Date 12/06/21   ?  ? PT LONG TERM GOAL #4  ? Title Pt will report </= 2/10 pain with sleep to demonstrate improvement in pain   ? Baseline 10/25/21: Up to 5/10 NPS with sleep and waking up in mornings   ? Time 6   ? Period Weeks   ? Status New   ? Target Date 12/06/21   ? ?  ?  ? ?  ? ? ? ? ? ? ? ? ? Plan - 10/25/21 1516   ? ? Clinical Impression Statement Pt is a pleasant 81 y.o. female referred to PT for possible lumbar strain on 10/11/21. Pt presents with normal lower quarter neuro screen (normal sensation to light touch and 2+ reflexes in patella and achilles) with equal strength from RLE to LLE with minor proximal hip weakness. Pt has lumbar mobility WNL with flexion limited by ~50% but no concordant symptoms with negative SIJ testing cluster, negative FADIR bilat with positive FABER on RLE and negative FABER on LLE. Pt has concordant pain with deep palpation to insertion of piriformis that is reproducible with hip IR in prone that is worsened with palpation. Pt has normal joint springing in hip AP and lumbar CPA's and UPA's with no pain or symptoms. Pt educated on sleeping posture, bed mobility, and HEP of hip mobility to improve  deficits which are limiting pt from sleep and getting in/out of bed without support from husband. Pt will benefit from skillde PT services for these deficits and ergnomics to prevent future injury with ADL completi

## 2021-10-31 ENCOUNTER — Other Ambulatory Visit: Payer: Self-pay

## 2021-10-31 ENCOUNTER — Ambulatory Visit: Payer: Medicare HMO

## 2021-10-31 DIAGNOSIS — M25551 Pain in right hip: Secondary | ICD-10-CM

## 2021-10-31 DIAGNOSIS — M62838 Other muscle spasm: Secondary | ICD-10-CM | POA: Diagnosis not present

## 2021-10-31 NOTE — Therapy (Signed)
?OUTPATIENT PHYSICAL THERAPY TREATMENT NOTE ? ? ?Patient Name: Denise Rhodes ?MRN: [id number] ?DOB:October 16, 1940, 81 y.o., female ?Today's Date: 10/31/2021 ? ?PCP: Venia Carbon, MD ?REFERRING PROVIDER: Carmelia Roller, MD ? ? PT End of Session - 10/31/21 1421   ? ? Visit Number 2   ? Number of Visits 13   ? Date for PT Re-Evaluation 12/06/21   ? PT Start Time 1421   ? PT Stop Time 1502   ? PT Time Calculation (min) 41 min   ? Activity Tolerance Patient tolerated treatment well   ? Behavior During Therapy [facility] for tasks assessed/performed   ? ?  ?  ? ?  ? ? ?Past Medical History:  ?Diagnosis Date  ? Acute upper respiratory infections of unspecified site   ? Anxiety state, unspecified   ? Esophagitis 7/15  ? and gastritis on EGD-- Dr Gustavo Lah  ? Gilbert's syndrome   ? H/O: pneumonia 09/2006  ? IBS (irritable bowel syndrome)   ? Mitral valve disorders(424.0)   ? Osteoporosis, unspecified   ? Urinary tract infection, site not specified   ? ?Past Surgical History:  ?Procedure Laterality Date  ? Bladder tack    ? CATARACT EXTRACTION, BILATERAL  9/12, 1/13  ? MINOR EXCISION OF ORAL LESION    ? TOTAL ABDOMINAL HYSTERECTOMY W/ BILATERAL SALPINGOOPHORECTOMY  2000  ? Vaginectomy colpocleisis cysto  2/16  ? ?Patient Active Problem List  ? Diagnosis Date Noted  ? Lumbar back sprain 10/20/2021  ? Dizziness 05/24/2021  ? Recurrent UTI 03/20/2017  ? GERD (gastroesophageal reflux disease) 11/26/2016  ? Advance directive discussed with patient 11/16/2014  ? Atrial tachycardia () 06/10/2013  ? Routine general medical examination at a health care facility 11/21/2011  ? IBS (irritable bowel syndrome)   ? Episodic mood disorder ([place]) 03/20/2007  ? Mitral valve disorder 03/20/2007  ? Osteoporosis 03/20/2007  ? ? ?REFERRING DIAG: lumbar radiculopathy ? ?THERAPY DIAG:  ?Pain in right hip ? ?Other muscle spasm ? ?PERTINENT HISTORY: Pt is a 81 y.o. female referred to OP PT for LBP after an ED visit on 10/11/21 which MD's seem to believe is  a musculoskeletal injury. PMH includes: Gill Bears Syndrome, IBS, anxiety, osteoporosis. Pt reports onset of LBP on March 8th early in hte morning which she attributes to cleaning the day prior that may have caused her pain. Reports pain slowly improving, its worst in the morning that improves with time and movement but worsening of pain in evenings after activit. Initially pain described at a 10/10 described as a muscle spasm. Now pain currently described as soreness, from R sided low back into hip that is constant ache. Heat helps relax more so than ice pack. Current worst pain since ED visit has been a 4-5/10 in mornings, 2-3/10 is best. Currently no LBP. Activities that worsen pain: Pt has a hard time describing painful movements besides bed mobility. Pt denies B/B changes, saddle anesthesia, fever, signifiacnt weight loss. Pt's major goal is to improve pain and prevent future issues and back pain. ? ?PRECAUTIONS: No known precautions ? ?SUBJECTIVE: Back is getting better. felt a few sharp pains when she got up this morning. Walking around and taking ibuprofen helped. Walking helps her. Did some carrying Sunday which bothered her back. Felt worse Monday. Can't lie on her R side. Normally sleeps on her L side. Moves to her R side when she wakes up. ? ?PAIN:  ?Are you having pain? No ? ? ?Objective measurements completed on examination: See  above findings.  ? ? ?Standign posture: L lateral shift ?Therapeutic exercise ?Seated manually resisted R lateral shift isometrics in neutral  ? ?Sit <> stand 5 times ? 15.66 seconds, no UE assist. Demonstrates R trunk side bend and B genu valgus as she stands up.  ? ? ?Ergonomic lifting  ? Pt demnostrates increased use of back instead of B hip and knees ? Lifting 9.5 lb box with emphasis on utilization of hips and knees 2x3 ?  Improved ergonomics after cues ? ? ?Bed mobility ? Supine <> sit R side multiple times ? Log rolling R and L multiple times ? Emphasis on proper  form ? ?S/L clamshel  ? R 10x3 ? ? L 10x3 ? ? ?Hooklying posterior pelvic tilt 10x3 with 5 second holds ? ? ? ? ?Improved exercise technique, movement at target joints, use of target muscles after mod verbal, visual, tactile cues.  ? ? ? ? ? ?Response to treatment ?Pt tolerated session well without aggravation of symptoms.  ? ? ? ?Clinical impression ?Demonstrates L lateral lean today, weak core and glute strength during functional activities such as sit <> stand (R lumbar side bend and B genu valgus compensation), and difficulty with lumbopelvic control during bed mobility. Worked on proper hip and knee use for ergonomic lifting to decrease stress to her back. Pt tolerated session well without aggravatoin of symptoms. Pt will benefit from continued skilled physical therapy services to decrease pain, improve strength and function.  ? ? ? ? ? ? ?PATIENT EDUCATION: ?Education details: there-ex, HEP ?Person educated: Patient and Spouse ?Education method: Explanation, Demonstration, Tactile cues, Verbal cues, and Handouts ?Education comprehension: verbalized understanding and returned demonstration ? ? ?Access Code: [email or web address] ?URL: [email or web address] ?Date: 10/31/2021 ?Prepared by: Joneen Boers ? ?Exercises ?- Supine Single Knee to Chest Stretch  - 1 x daily - 7 x weekly - 2 sets - 15 reps - 5 hold ?- Seated Figure 4 Piriformis Stretch  - 1 x daily - 7 x weekly - 2 sets - 5 reps - 20 hold ?- Clamshell  - 1 x daily - 7 x weekly - 3 sets - 10 reps ?- Supine Posterior Pelvic Tilt  - 1 x daily - 7 x weekly - 3 sets - 10 reps - 5 seconds hold ? ? ? ? PT Short Term Goals - 10/25/21 1524   ? ?  ? PT SHORT TERM GOAL #1  ? Title pt will be independent with HEP to improve hip flexibility, pain, and strength to return to PLOF   ? Baseline 10/25/21: initiated   ? Time 3   ? Period Weeks   ? Status New   ? Target Date 11/08/21   ? ?  ?  ? ?  ? ? ? PT Long Term Goals - 10/25/21 1524   ? ?  ? PT LONG TERM GOAL #1  ?  Title Pt will improve FOTO to target score or greater to demo clinically significant improvement in functional mobility   ? Baseline 10/25/21: 54 with target score of 69   ? Time 6   ? Period Weeks   ? Status New   ? Target Date 12/06/21   ?  ? PT LONG TERM GOAL #2  ? Title Pt will improve 5xSTS by at least 3 seconds to indicate clinically significant improvement in LE strength   ? Baseline 10/25/21: Next session   ? Time 6   ? Period Weeks   ? Status New   ?  Target Date 12/06/21   ?  ? PT LONG TERM GOAL #3  ? Title Pt will demonstrate safe body mechanics and ergonomics with lifting, squatting, and push/pull ADL's to prevent risk of future MSK injury   ? Baseline 10/25/21: Needs f/u assessment on body mechanics and ergonomics in POC   ? Time 6   ? Period Weeks   ? Status New   ? Target Date 12/06/21   ?  ? PT LONG TERM GOAL #4  ? Title Pt will report </= 2/10 pain with sleep to demonstrate improvement in pain   ? Baseline 10/25/21: Up to 5/10 NPS with sleep and waking up in mornings   ? Time 6   ? Period Weeks   ? Status New   ? Target Date 12/06/21   ? ?  ?  ? ?  ? ? ? Plan - 10/31/21 1839   ? ? Clinical Impression Statement Demonstrates L lateral lean today, weak core and glute strength during functional activities such as sit <> stand (R lumbar side bend and B genu valgus compensation), and difficulty with lumbopelvic control during bed mobility. Worked on proper hip and knee use for ergonomic lifting to decrease stress to her back. Pt tolerated session well without aggravatoin of symptoms. Pt will benefit from continued skilled physical therapy services to decrease pain, improve strength and function.   ? Personal Factors and Comorbidities Age;Time since onset of injury/illness/exacerbation;Comorbidity 3+;Past/Current Experience   ? Comorbidities Gill Bears Syndrome, IBS, anxiety, osteoporosis.   ? Examination-Activity Limitations Lift;Bed Mobility;Bend;Sleep   ? Examination-Participation Restrictions  Cleaning;Laundry;Community Activity   ? Stability/Clinical Decision Making Stable/Uncomplicated   ? Clinical Decision Making Low   ? Rehab Potential Good   ? PT Frequency 2x / week   ? PT Duration 6 weeks   ? PT Treatment/In

## 2021-11-02 ENCOUNTER — Ambulatory Visit: Payer: Medicare HMO

## 2021-11-07 ENCOUNTER — Ambulatory Visit: Payer: Medicare HMO | Attending: Orthopedic Surgery

## 2021-11-07 DIAGNOSIS — M25551 Pain in right hip: Secondary | ICD-10-CM | POA: Diagnosis not present

## 2021-11-07 DIAGNOSIS — M62838 Other muscle spasm: Secondary | ICD-10-CM | POA: Insufficient documentation

## 2021-11-07 NOTE — Therapy (Signed)
?OUTPATIENT PHYSICAL THERAPY TREATMENT NOTE ? ? ?Patient Name: Denise Rhodes ?MRN: [id number] ?DOB:12/31/1940, 81 y.o., female ?Today's Date: 11/07/2021 ? ?PCP: Venia Carbon, MD ?REFERRING PROVIDER: Carmelia Roller, MD ? ? PT End of Session - 11/07/21 1332   ? ? Visit Number 3   ? Number of Visits 13   ? Date for PT Re-Evaluation 12/06/21   ? PT Start Time 1332   ? PT Stop Time 1414   ? PT Time Calculation (min) 42 min   ? Activity Tolerance Patient tolerated treatment well   ? Behavior During Therapy [facility] for tasks assessed/performed   ? ?  ?  ? ?  ? ? ? ?Past Medical History:  ?Diagnosis Date  ? Acute upper respiratory infections of unspecified site   ? Anxiety state, unspecified   ? Esophagitis 7/15  ? and gastritis on EGD-- Dr Gustavo Lah  ? Gilbert's syndrome   ? H/O: pneumonia 09/2006  ? IBS (irritable bowel syndrome)   ? Mitral valve disorders(424.0)   ? Osteoporosis, unspecified   ? Urinary tract infection, site not specified   ? ?Past Surgical History:  ?Procedure Laterality Date  ? Bladder tack    ? CATARACT EXTRACTION, BILATERAL  9/12, 1/13  ? MINOR EXCISION OF ORAL LESION    ? TOTAL ABDOMINAL HYSTERECTOMY W/ BILATERAL SALPINGOOPHORECTOMY  2000  ? Vaginectomy colpocleisis cysto  2/16  ? ?Patient Active Problem List  ? Diagnosis Date Noted  ? Lumbar back sprain 10/20/2021  ? Dizziness 05/24/2021  ? Recurrent UTI 03/20/2017  ? GERD (gastroesophageal reflux disease) 11/26/2016  ? Advance directive discussed with patient 11/16/2014  ? Atrial tachycardia ([place]) 06/10/2013  ? Routine general medical examination at a health care facility 11/21/2011  ? IBS (irritable bowel syndrome)   ? Episodic mood disorder ([place]) 03/20/2007  ? Mitral valve disorder 03/20/2007  ? Osteoporosis 03/20/2007  ? ? ?REFERRING DIAG: lumbar radiculopathy ? ?THERAPY DIAG:  ?Pain in right hip ? ?Other muscle spasm ? ?PERTINENT HISTORY: Pt is a 81 y.o. female referred to OP PT for LBP after an ED visit on 10/11/21 which MD's seem to believe is  a musculoskeletal injury. PMH includes: Gill Bears Syndrome, IBS, anxiety, osteoporosis. Pt reports onset of LBP on March 8th early in hte morning which she attributes to cleaning the day prior that may have caused her pain. Reports pain slowly improving, its worst in the morning that improves with time and movement but worsening of pain in evenings after activit. Initially pain described at a 10/10 described as a muscle spasm. Now pain currently described as soreness, from R sided low back into hip that is constant ache. Heat helps relax more so than ice pack. Current worst pain since ED visit has been a 4-5/10 in mornings, 2-3/10 is best. Currently no LBP. Activities that worsen pain: Pt has a hard time describing painful movements besides bed mobility. Pt denies B/B changes, saddle anesthesia, fever, signifiacnt weight loss. Pt's major goal is to improve pain and prevent future issues and back pain. ? ?PRECAUTIONS: No known precautions ? ?SUBJECTIVE: No questions with her exercises. Back still feels uncomfortable but its not anything bad. Did some exercises yesterday. Felt a little bit sore after that but expected it.  ? ? ? ?PAIN:  ?Are you having pain? No ? ? ?Objective measurements completed on examination: See above findings.  ? ? ?Standign posture: L lateral shift ?Therapeutic exercise ? ?Supine knee to chest R side ? ?Supine R hip flexor  stretch off the table 30 seconds 3 ? ?Hooklying posterior pelvic tilt 10x5 seconds for 2 sets ?  ? Then with marches 10x2 each LE ? ?Hooklying clamshell red band 10x2 ? ? ?Seated manually resisted R lateral shift isometrics in neutral 10x5 seconds  ? ?Seated B scapular retraction 10x5 seconds for 2 sets ? ?Sit <> supine 1x, cues for proper mechanics and technique ? ? ?Improved exercise technique, movement at target joints, use of target muscles after mod verbal, visual, tactile cues.  ? ? ? ? ? ?Response to treatment ?Pt tolerated session well without aggravation of  symptoms.  ? ? ? ?Clinical impression ?Continued working on improving core and glute strength secondary to weakness and improve lumbo pelvic mechanics to decrease stress to her low back.  Pt tolerated session well without aggravatoin of symptoms. Pt will benefit from continued skilled physical therapy services to decrease pain, improve strength and function.  ? ? ? ? ? ? ?PATIENT EDUCATION: ?Education details: there-ex, HEP ?Person educated: Patient and Spouse ?Education method: Explanation, Demonstration, Tactile cues, Verbal cues, and Handouts ?Education comprehension: verbalized understanding and returned demonstration ? ? ?Access Code: [email or web address] ?URL: [email or web address] ?Date: 10/31/2021 ?Prepared by: Joneen Boers ? ?Exercises ?- Supine Single Knee to Chest Stretch  - 1 x daily - 7 x weekly - 2 sets - 15 reps - 5 hold ?- Seated Figure 4 Piriformis Stretch  - 1 x daily - 7 x weekly - 2 sets - 5 reps - 20 hold ?- Clamshell  - 1 x daily - 7 x weekly - 3 sets - 10 reps ?- Supine Posterior Pelvic Tilt  - 1 x daily - 7 x weekly - 3 sets - 10 reps - 5 seconds hold ?- Modified Thomas Stretch  - 2-3 x daily - 7 x weekly - 1 sets - 3 reps - 30 seconds hold ? ? ? PT Short Term Goals - 10/25/21 1524   ? ?  ? PT SHORT TERM GOAL #1  ? Title pt will be independent with HEP to improve hip flexibility, pain, and strength to return to PLOF   ? Baseline 10/25/21: initiated   ? Time 3   ? Period Weeks   ? Status New   ? Target Date 11/08/21   ? ?  ?  ? ?  ? ? ? PT Long Term Goals - 10/25/21 1524   ? ?  ? PT LONG TERM GOAL #1  ? Title Pt will improve FOTO to target score or greater to demo clinically significant improvement in functional mobility   ? Baseline 10/25/21: 54 with target score of 69   ? Time 6   ? Period Weeks   ? Status New   ? Target Date 12/06/21   ?  ? PT LONG TERM GOAL #2  ? Title Pt will improve 5xSTS by at least 3 seconds to indicate clinically significant improvement in LE strength   ? Baseline  10/25/21: Next session   ? Time 6   ? Period Weeks   ? Status New   ? Target Date 12/06/21   ?  ? PT LONG TERM GOAL #3  ? Title Pt will demonstrate safe body mechanics and ergonomics with lifting, squatting, and push/pull ADL's to prevent risk of future MSK injury   ? Baseline 10/25/21: Needs f/u assessment on body mechanics and ergonomics in POC   ? Time 6   ? Period Weeks   ? Status New   ? Target Date  12/06/21   ?  ? PT LONG TERM GOAL #4  ? Title Pt will report </= 2/10 pain with sleep to demonstrate improvement in pain   ? Baseline 10/25/21: Up to 5/10 NPS with sleep and waking up in mornings   ? Time 6   ? Period Weeks   ? Status New   ? Target Date 12/06/21   ? ?  ?  ? ?  ? ? ? Plan - 11/07/21 1330   ? ? Clinical Impression Statement Continued working on improving core and glute strength secondary to weakness and improve lumbo pelvic mechanics to decrease stress to her low back.  Pt tolerated session well without aggravatoin of symptoms. Pt will benefit from continued skilled physical therapy services to decrease pain, improve strength and function.   ? Personal Factors and Comorbidities Age;Time since onset of injury/illness/exacerbation;Comorbidity 3+;Past/Current Experience   ? Comorbidities Gill Bears Syndrome, IBS, anxiety, osteoporosis.   ? Examination-Activity Limitations Lift;Bed Mobility;Bend;Sleep   ? Examination-Participation Restrictions Cleaning;Laundry;Community Activity   ? Stability/Clinical Decision Making Stable/Uncomplicated   ? Clinical Decision Making Low   ? Rehab Potential Good   ? PT Frequency 2x / week   ? PT Duration 6 weeks   ? PT Treatment/Interventions ADLs/Self Care Home Management;Electrical Stimulation;Cryotherapy;Canalith Repostioning;Moist Heat;Traction;Gait training;Stair training;Functional mobility training;Therapeutic activities;Patient/family education;Therapeutic exercise;Balance training;Neuromuscular re-education;Manual techniques;Dry needling;Passive range of  motion;Joint Manipulations;Spinal Manipulations   ? PT Next Visit Plan Reassess HEP, hip flexibility, 5xSTS   ? PT Home Exercise Plan Access Code: [email or web address]   ? Consulted and Agree with Plan of Care Patient;Family member/

## 2021-11-13 ENCOUNTER — Ambulatory Visit: Payer: Medicare HMO

## 2021-11-13 DIAGNOSIS — M25551 Pain in right hip: Secondary | ICD-10-CM

## 2021-11-13 DIAGNOSIS — M62838 Other muscle spasm: Secondary | ICD-10-CM | POA: Diagnosis not present

## 2021-11-13 NOTE — Therapy (Signed)
?OUTPATIENT PHYSICAL THERAPY TREATMENT NOTE ? ? ?Patient Name: Denise Rhodes ?MRN: [id number] ?DOB:1940-08-13, 81 y.o., female ?Today's Date: 11/13/2021 ? ?PCP: Venia Carbon, MD ?REFERRING PROVIDER: Carmelia Roller, MD ? ? PT End of Session - 11/13/21 1548   ? ? Visit Number 4   ? Number of Visits 13   ? Date for PT Re-Evaluation 12/06/21   ? PT Start Time 5697   ? PT Stop Time 1628   ? PT Time Calculation (min) 39 min   ? Activity Tolerance Patient tolerated treatment well   ? Behavior During Therapy [facility] for tasks assessed/performed   ? ?  ?  ? ?  ? ? ? ? ?Past Medical History:  ?Diagnosis Date  ? Acute upper respiratory infections of unspecified site   ? Anxiety state, unspecified   ? Esophagitis 7/15  ? and gastritis on EGD-- Dr Gustavo Lah  ? Gilbert's syndrome   ? H/O: pneumonia 09/2006  ? IBS (irritable bowel syndrome)   ? Mitral valve disorders(424.0)   ? Osteoporosis, unspecified   ? Urinary tract infection, site not specified   ? ?Past Surgical History:  ?Procedure Laterality Date  ? Bladder tack    ? CATARACT EXTRACTION, BILATERAL  9/12, 1/13  ? MINOR EXCISION OF ORAL LESION    ? TOTAL ABDOMINAL HYSTERECTOMY W/ BILATERAL SALPINGOOPHORECTOMY  2000  ? Vaginectomy colpocleisis cysto  2/16  ? ?Patient Active Problem List  ? Diagnosis Date Noted  ? Lumbar back sprain 10/20/2021  ? Dizziness 05/24/2021  ? Recurrent UTI 03/20/2017  ? GERD (gastroesophageal reflux disease) 11/26/2016  ? Advance directive discussed with patient 11/16/2014  ? Atrial tachycardia ([place]) 06/10/2013  ? Routine general medical examination at a health care facility 11/21/2011  ? IBS (irritable bowel syndrome)   ? Episodic mood disorder ([place]) 03/20/2007  ? Mitral valve disorder 03/20/2007  ? Osteoporosis 03/20/2007  ? ? ?REFERRING DIAG: lumbar radiculopathy ? ?THERAPY DIAG:  ?Pain in right hip ? ?Other muscle spasm ? ?PERTINENT HISTORY: Pt is a 81 y.o. female referred to OP PT for LBP after an ED visit on 10/11/21 which MD's seem to believe  is a musculoskeletal injury. PMH includes: Gill Bears Syndrome, IBS, anxiety, osteoporosis. Pt reports onset of LBP on March 8th early in hte morning which she attributes to cleaning the day prior that may have caused her pain. Reports pain slowly improving, its worst in the morning that improves with time and movement but worsening of pain in evenings after activit. Initially pain described at a 10/10 described as a muscle spasm. Now pain currently described as soreness, from R sided low back into hip that is constant ache. Heat helps relax more so than ice pack. Current worst pain since ED visit has been a 4-5/10 in mornings, 2-3/10 is best. Currently no LBP. Activities that worsen pain: Pt has a hard time describing painful movements besides bed mobility. Pt denies B/B changes, saddle anesthesia, fever, signifiacnt weight loss. Pt's major goal is to improve pain and prevent future issues and back pain. ? ?PRECAUTIONS: No known precautions ? ?SUBJECTIVE: Back is getting better. Still has discomfort during real busy days. Pt states she feels better when she goes to PT.  ? ? ? ? ?PAIN:  ?Are you having pain? No ? ? ?Objective measurements completed on examination: See above findings.  ? ? ?Standign posture: L lateral shift ? ?Manual therapy   ? ?Seated STM R and L quadratus lumborum muscles to decrease muscle tension  ? ?  Therapeutic exercise ? ?Seated B scapular retraction 10x5 seconds for 3 sets to promote thoracic extension and decrease lumbar extension stress.  ? ?Hooklying posterior pelvic tilt 10x5 seconds for 2 sets ?  ? Then with marches 10x3 each LE ? ?Hooklying clamshell red band 10x3 ? ?Hooklying hip adduction isometrics with small green ball 10x5 seconds for 3 sets ? ?Seated manually resisted R lateral shift isometrics in neutral 10x5 seconds  ? ? ? ? ? ?Improved exercise technique, movement at target joints, use of target muscles after mod verbal, visual, tactile cues.  ? ? ? ? ? ?Response to  treatment ?Pt tolerated session well without aggravation of symptoms.  ? ? ? ?Clinical impression ?Decreasing overall back pain based on subjective reports. Continued working on improving thoracic extension, trunk and glute strength, and posture to decrease stress to low back with standing tasks. Pt tolerated session well without aggravation of symptoms. Pt will benefit from continued skilled physical therapy services to decrease pain, improve strength and function.  ? ? ? ? ? ? ?PATIENT EDUCATION: ?Education details: there-ex, HEP ?Person educated: Patient and Spouse ?Education method: Explanation, Demonstration, Tactile cues, Verbal cues, and Handouts ?Education comprehension: verbalized understanding and returned demonstration ? ? ?Access Code: [email or web address] ?URL: [email or web address] ?Date: 10/31/2021 ?Prepared by: Joneen Boers ? ?Exercises ?- Supine Single Knee to Chest Stretch  - 1 x daily - 7 x weekly - 2 sets - 15 reps - 5 hold ?- Seated Figure 4 Piriformis Stretch  - 1 x daily - 7 x weekly - 2 sets - 5 reps - 20 hold ?- Clamshell  - 1 x daily - 7 x weekly - 3 sets - 10 reps ?- Supine Posterior Pelvic Tilt  - 1 x daily - 7 x weekly - 3 sets - 10 reps - 5 seconds hold ?- Modified Thomas Stretch  - 2-3 x daily - 7 x weekly - 1 sets - 3 reps - 30 seconds hold ? ? ? PT Short Term Goals - 10/25/21 1524   ? ?  ? PT SHORT TERM GOAL #1  ? Title pt will be independent with HEP to improve hip flexibility, pain, and strength to return to PLOF   ? Baseline 10/25/21: initiated   ? Time 3   ? Period Weeks   ? Status New   ? Target Date 11/08/21   ? ?  ?  ? ?  ? ? ? PT Long Term Goals - 10/25/21 1524   ? ?  ? PT LONG TERM GOAL #1  ? Title Pt will improve FOTO to target score or greater to demo clinically significant improvement in functional mobility   ? Baseline 10/25/21: 54 with target score of 69   ? Time 6   ? Period Weeks   ? Status New   ? Target Date 12/06/21   ?  ? PT LONG TERM GOAL #2  ? Title Pt will  improve 5xSTS by at least 3 seconds to indicate clinically significant improvement in LE strength   ? Baseline 10/25/21: Next session   ? Time 6   ? Period Weeks   ? Status New   ? Target Date 12/06/21   ?  ? PT LONG TERM GOAL #3  ? Title Pt will demonstrate safe body mechanics and ergonomics with lifting, squatting, and push/pull ADL's to prevent risk of future MSK injury   ? Baseline 10/25/21: Needs f/u assessment on body mechanics and ergonomics in POC   ? Time 6   ?  Period Weeks   ? Status New   ? Target Date 12/06/21   ?  ? PT LONG TERM GOAL #4  ? Title Pt will report </= 2/10 pain with sleep to demonstrate improvement in pain   ? Baseline 10/25/21: Up to 5/10 NPS with sleep and waking up in mornings   ? Time 6   ? Period Weeks   ? Status New   ? Target Date 12/06/21   ? ?  ?  ? ?  ? ? ? Plan - 11/13/21 1547   ? ? Clinical Impression Statement Decreasing overall back pain based on subjective reports. Continued working on improving thoracic extension, trunk and glute strength, and posture to decrease stress to low back with standing tasks. Pt tolerated session well without aggravation of symptoms. Pt will benefit from continued skilled physical therapy services to decrease pain, improve strength and function.   ? Personal Factors and Comorbidities Age;Time since onset of injury/illness/exacerbation;Comorbidity 3+;Past/Current Experience   ? Comorbidities Gill Bears Syndrome, IBS, anxiety, osteoporosis.   ? Examination-Activity Limitations Lift;Bed Mobility;Bend;Sleep   ? Examination-Participation Restrictions Cleaning;Laundry;Community Activity   ? Stability/Clinical Decision Making Stable/Uncomplicated   ? Rehab Potential Good   ? PT Frequency 2x / week   ? PT Duration 6 weeks   ? PT Treatment/Interventions ADLs/Self Care Home Management;Electrical Stimulation;Cryotherapy;Canalith Repostioning;Moist Heat;Traction;Gait training;Stair training;Functional mobility training;Therapeutic activities;Patient/family  education;Therapeutic exercise;Balance training;Neuromuscular re-education;Manual techniques;Dry needling;Passive range of motion;Joint Manipulations;Spinal Manipulations   ? PT Next Visit Plan Reassess HEP, hip flexibil

## 2021-11-15 ENCOUNTER — Ambulatory Visit: Payer: Medicare HMO

## 2021-11-15 DIAGNOSIS — M25551 Pain in right hip: Secondary | ICD-10-CM | POA: Diagnosis not present

## 2021-11-15 DIAGNOSIS — M62838 Other muscle spasm: Secondary | ICD-10-CM

## 2021-11-15 NOTE — Therapy (Signed)
?OUTPATIENT PHYSICAL THERAPY TREATMENT NOTE ? ? ?Patient Name: Denise Rhodes ?MRN: [id number] ?DOB:Jun 10, 1941, 81 y.o., female ?Today's Date: 11/15/2021 ? ?PCP: Venia Carbon, MD ?REFERRING PROVIDER: Carmelia Roller, MD ? ? PT End of Session - 11/15/21 1503   ? ? Visit Number 5   ? Number of Visits 13   ? Date for PT Re-Evaluation 12/06/21   ? PT Start Time 1503   ? PT Stop Time 1543   ? PT Time Calculation (min) 40 min   ? Activity Tolerance Patient tolerated treatment well   ? Behavior During Therapy [facility] for tasks assessed/performed   ? ?  ?  ? ?  ? ? ? ? ? ?Past Medical History:  ?Diagnosis Date  ? Acute upper respiratory infections of unspecified site   ? Anxiety state, unspecified   ? Esophagitis 7/15  ? and gastritis on EGD-- Dr Gustavo Lah  ? Gilbert's syndrome   ? H/O: pneumonia 09/2006  ? IBS (irritable bowel syndrome)   ? Mitral valve disorders(424.0)   ? Osteoporosis, unspecified   ? Urinary tract infection, site not specified   ? ?Past Surgical History:  ?Procedure Laterality Date  ? Bladder tack    ? CATARACT EXTRACTION, BILATERAL  9/12, 1/13  ? MINOR EXCISION OF ORAL LESION    ? TOTAL ABDOMINAL HYSTERECTOMY W/ BILATERAL SALPINGOOPHORECTOMY  2000  ? Vaginectomy colpocleisis cysto  2/16  ? ?Patient Active Problem List  ? Diagnosis Date Noted  ? Lumbar back sprain 10/20/2021  ? Dizziness 05/24/2021  ? Recurrent UTI 03/20/2017  ? GERD (gastroesophageal reflux disease) 11/26/2016  ? Advance directive discussed with patient 11/16/2014  ? Atrial tachycardia ([place]) 06/10/2013  ? Routine general medical examination at a health care facility 11/21/2011  ? IBS (irritable bowel syndrome)   ? Episodic mood disorder ([place]) 03/20/2007  ? Mitral valve disorder 03/20/2007  ? Osteoporosis 03/20/2007  ? ? ?REFERRING DIAG: lumbar radiculopathy ? ?THERAPY DIAG:  ?Pain in right hip ? ?Other muscle spasm ? ?PERTINENT HISTORY: Pt is a 81 y.o. female referred to OP PT for LBP after an ED visit on 10/11/21 which MD's seem to  believe is a musculoskeletal injury. PMH includes: Gill Bears Syndrome, IBS, anxiety, osteoporosis. Pt reports onset of LBP on March 8th early in hte morning which she attributes to cleaning the day prior that may have caused her pain. Reports pain slowly improving, its worst in the morning that improves with time and movement but worsening of pain in evenings after activit. Initially pain described at a 10/10 described as a muscle spasm. Now pain currently described as soreness, from R sided low back into hip that is constant ache. Heat helps relax more so than ice pack. Current worst pain since ED visit has been a 4-5/10 in mornings, 2-3/10 is best. Currently no LBP. Activities that worsen pain: Pt has a hard time describing painful movements besides bed mobility. Pt denies B/B changes, saddle anesthesia, fever, signifiacnt weight loss. Pt's major goal is to improve pain and prevent future issues and back pain. ? ?PRECAUTIONS: No known precautions ? ?SUBJECTIVE: Back is ok. Went to a movie and sat for 2 hours and her back bothered her. Other than that, she's fine. Per husband, when pt sits for a certain period of time, her R low back would bother her. Back does not bother her when she moves in bed. Per husband, pt has been sleeping pretty good.  ? ? ?PAIN:  ?Are you having pain? No ? ? ?  Objective measurements completed on examination: See above findings.  ? ? ?Standign posture: L lateral shift ? ? ?Therapeutic exercise ? ? ?Sitting with folded towel on L side. Decreased R low back pressure observed.  ? ?Seated hip extension isometrics  ? L 10x5 seconds for 2 sets ? Decreased R low back pressure observed. ? ?Sitting with upright posture (to decrease R side bend pressure) with transversus abdominis contraction 10x3 with 5 second holds  ? ?Sitting B shoulder extension isometrics, hands on thighs, 10x5 seconds for 3 sets ? ?SLS R LE with B UE assist 10x5 seconds for 2 sets ? ?Seated B scapular retraction 10x5 seconds  for 3 sets to promote thoracic extension and decrease lumbar extension stress.  ? ? ? ? ? ?Improved exercise technique, movement at target joints, use of target muscles after mod verbal, visual, tactile cues.  ? ? ? ? ? ?Response to treatment ?Pt tolerated session well without aggravation of symptoms.  ? ? ? ?Clinical impression ?Decreased low back pain with bed mobility based on subjective reports. Worked on improving posture and trunk strength to decrease R lumbar side bend when sitting. Also worked on thoracic extension and R glute med strengthening to decrease stress to R low back during sitting and standing activities. Pt tolerated session well without aggravation of symptoms. Good muscle use observed with exercises. Pt will benefit from continued skilled physical therapy services to decrease pain, improve strength and function.  ? ? ? ? ? ? ?PATIENT EDUCATION: ?Education details: there-ex, HEP ?Person educated: Patient and Spouse ?Education method: Explanation, Demonstration, Tactile cues, Verbal cues, and Handouts ?Education comprehension: verbalized understanding and returned demonstration ? ? ?Access Code: [email or web address] ?URL: [email or web address] ?Date: 10/31/2021 ?Prepared by: Joneen Boers ? ?Exercises ?- Supine Single Knee to Chest Stretch  - 1 x daily - 7 x weekly - 2 sets - 15 reps - 5 hold ?- Seated Figure 4 Piriformis Stretch  - 1 x daily - 7 x weekly - 2 sets - 5 reps - 20 hold ?- Clamshell  - 1 x daily - 7 x weekly - 3 sets - 10 reps ?- Supine Posterior Pelvic Tilt  - 1 x daily - 7 x weekly - 3 sets - 10 reps - 5 seconds hold ?- Modified Thomas Stretch  - 2-3 x daily - 7 x weekly - 1 sets - 3 reps - 30 seconds hold ?- Seated Transversus Abdominis Bracing  - 3 x daily - 7 x weekly - 3 sets - 10 reps - 5 seconds hold ? ? PT Short Term Goals - 10/25/21 1524   ? ?  ? PT SHORT TERM GOAL #1  ? Title pt will be independent with HEP to improve hip flexibility, pain, and strength to return to PLOF    ? Baseline 10/25/21: initiated   ? Time 3   ? Period Weeks   ? Status New   ? Target Date 11/08/21   ? ?  ?  ? ?  ? ? ? PT Long Term Goals - 10/25/21 1524   ? ?  ? PT LONG TERM GOAL #1  ? Title Pt will improve FOTO to target score or greater to demo clinically significant improvement in functional mobility   ? Baseline 10/25/21: 54 with target score of 69   ? Time 6   ? Period Weeks   ? Status New   ? Target Date 12/06/21   ?  ? PT LONG TERM GOAL #2  ? Title Pt  will improve 5xSTS by at least 3 seconds to indicate clinically significant improvement in LE strength   ? Baseline 10/25/21: Next session   ? Time 6   ? Period Weeks   ? Status New   ? Target Date 12/06/21   ?  ? PT LONG TERM GOAL #3  ? Title Pt will demonstrate safe body mechanics and ergonomics with lifting, squatting, and push/pull ADL's to prevent risk of future MSK injury   ? Baseline 10/25/21: Needs f/u assessment on body mechanics and ergonomics in POC   ? Time 6   ? Period Weeks   ? Status New   ? Target Date 12/06/21   ?  ? PT LONG TERM GOAL #4  ? Title Pt will report </= 2/10 pain with sleep to demonstrate improvement in pain   ? Baseline 10/25/21: Up to 5/10 NPS with sleep and waking up in mornings   ? Time 6   ? Period Weeks   ? Status New   ? Target Date 12/06/21   ? ?  ?  ? ?  ? ? ? Plan - 11/15/21 1550   ? ? Clinical Impression Statement Decreased low back pain with bed mobility based on subjective reports. Worked on improving posture and trunk strength to decrease R lumbar side bend when sitting. Also worked on thoracic extension and R glute med strengthening to decrease stress to R low back during sitting and standing activities. Pt tolerated session well without aggravation of symptoms. Good muscle use observed with exercises. Pt will benefit from continued skilled physical therapy services to decrease pain, improve strength and function.   ? Personal Factors and Comorbidities Age;Time since onset of injury/illness/exacerbation;Comorbidity  3+;Past/Current Experience   ? Comorbidities Gill Bears Syndrome, IBS, anxiety, osteoporosis.   ? Examination-Activity Limitations Lift;Bed Mobility;Bend;Sleep   ? Examination-Participation Restrictions Clean

## 2021-11-20 ENCOUNTER — Ambulatory Visit: Payer: Medicare HMO

## 2021-11-20 DIAGNOSIS — M25551 Pain in right hip: Secondary | ICD-10-CM

## 2021-11-20 DIAGNOSIS — M62838 Other muscle spasm: Secondary | ICD-10-CM

## 2021-11-20 NOTE — Patient Instructions (Signed)
Seated hip extension isometrics ? ? ?Sitting on a chair,  ? ? Squeeze your rear end muscles together and press your L foot onto the floor.  ? ? ? Hold for 5 seconds  ? ? Repeat 10 times ? ? Perform 3 sets daily.  ? ? ? ? This is a corrective exercise. Once you no longer have symptoms, you can stop.  ? ?

## 2021-11-20 NOTE — Therapy (Signed)
?OUTPATIENT PHYSICAL THERAPY TREATMENT NOTE ? ? ?Patient Name: Denise Rhodes ?MRN: [id number] ?DOB:June 11, 1941, 81 y.o., female ?Today's Date: 11/20/2021 ? ?PCP: Venia Carbon, MD ?REFERRING PROVIDER: Carmelia Roller, MD ? ? PT End of Session - 11/20/21 1545   ? ? Visit Number 6   ? Number of Visits 13   ? Date for PT Re-Evaluation 12/06/21   ? PT Start Time 1545   ? PT Stop Time 1625   ? PT Time Calculation (min) 40 min   ? Activity Tolerance Patient tolerated treatment well   ? Behavior During Therapy [facility] for tasks assessed/performed   ? ?  ?  ? ?  ? ? ? ? ? ? ?Past Medical History:  ?Diagnosis Date  ? Acute upper respiratory infections of unspecified site   ? Anxiety state, unspecified   ? Esophagitis 7/15  ? and gastritis on EGD-- Dr Gustavo Lah  ? Gilbert's syndrome   ? H/O: pneumonia 09/2006  ? IBS (irritable bowel syndrome)   ? Mitral valve disorders(424.0)   ? Osteoporosis, unspecified   ? Urinary tract infection, site not specified   ? ?Past Surgical History:  ?Procedure Laterality Date  ? Bladder tack    ? CATARACT EXTRACTION, BILATERAL  9/12, 1/13  ? MINOR EXCISION OF ORAL LESION    ? TOTAL ABDOMINAL HYSTERECTOMY W/ BILATERAL SALPINGOOPHORECTOMY  2000  ? Vaginectomy colpocleisis cysto  2/16  ? ?Patient Active Problem List  ? Diagnosis Date Noted  ? Lumbar back sprain 10/20/2021  ? Dizziness 05/24/2021  ? Recurrent UTI 03/20/2017  ? GERD (gastroesophageal reflux disease) 11/26/2016  ? Advance directive discussed with patient 11/16/2014  ? Atrial tachycardia ([place]) 06/10/2013  ? Routine general medical examination at a health care facility 11/21/2011  ? IBS (irritable bowel syndrome)   ? Episodic mood disorder ([place]) 03/20/2007  ? Mitral valve disorder 03/20/2007  ? Osteoporosis 03/20/2007  ? ? ?REFERRING DIAG: lumbar radiculopathy ? ?THERAPY DIAG:  ?Pain in right hip ? ?Other muscle spasm ? ?PERTINENT HISTORY: Pt is a 81 y.o. female referred to OP PT for LBP after an ED visit on 10/11/21 which MD's seem to  believe is a musculoskeletal injury. PMH includes: Gill Bears Syndrome, IBS, anxiety, osteoporosis. Pt reports onset of LBP on March 8th early in hte morning which she attributes to cleaning the day prior that may have caused her pain. Reports pain slowly improving, its worst in the morning that improves with time and movement but worsening of pain in evenings after activit. Initially pain described at a 10/10 described as a muscle spasm. Now pain currently described as soreness, from R sided low back into hip that is constant ache. Heat helps relax more so than ice pack. Current worst pain since ED visit has been a 4-5/10 in mornings, 2-3/10 is best. Currently no LBP. Activities that worsen pain: Pt has a hard time describing painful movements besides bed mobility. Pt denies B/B changes, saddle anesthesia, fever, signifiacnt weight loss. Pt's major goal is to improve pain and prevent future issues and back pain. ? ?PRECAUTIONS: No known precautions ? ?SUBJECTIVE: Back and hip are all better pt thinks. Has had some really good days.  ? ? ? ?PAIN:  ?Are you having pain? No ? ? ?Objective measurements completed on examination: See above findings.  ? ? ?Standign posture: L lateral shift ? ? ?Therapeutic exercise ? ?Seated hip extension isometrics  ? L 10x5 seconds for 3 sets ? Decreased R low back pressure observed. ?  Reviewed and given as part of her HEP. Pt demonstrated and verbalized understanding. Handout provided.  ? ?Sitting with upright posture (to decrease R side bend pressure) with transversus abdominis contraction 10x3 with 5 second holds  ? ?Seated B scapular retraction 10x5 seconds for 3 sets to promote thoracic extension and decrease lumbar extension stress.  ? ?Seated trunk flexion to the L to decrease R low back pressure. 20 seconds x 3 ? ? ?SLS R LE with B UE assist 10x5 seconds for 3 sets ? ?Standing, bend over on table ? Hip extension  ?  R 5x3 ?  L 5x3 ? ? ?Sitting B shoulder extension isometrics,  hands on thighs, 10x5 seconds ? ? ? ? ? ?Improved exercise technique, movement at target joints, use of target muscles after mod verbal, visual, tactile cues.  ? ? ? ? ? ?Response to treatment ?Pt tolerated session well without aggravation of symptoms.  ? ? ? ?Clinical impression ? ?Pt making very good progress with decreased back and R posterior hip pain based on subjective reports. Continued working on improving posture, trunk and glute strength as well as thoracic extension to decrease stress to her low back. . Pt tolerated session well without aggravation of symptoms. Good muscle use observed with exercises. Pt will benefit from continued skilled physical therapy services to decrease pain, improve strength and function.  ? ? ? ? ? ? ?PATIENT EDUCATION: ?Education details: there-ex, HEP ?Person educated: Patient and Spouse ?Education method: Explanation, Demonstration, Tactile cues, Verbal cues, and Handouts ?Education comprehension: verbalized understanding and returned demonstration ? ? ?Access Code: [email or web address] ?URL: [email or web address] ?Date: 10/31/2021 ?Prepared by: Joneen Boers ? ?Exercises ?- Supine Single Knee to Chest Stretch  - 1 x daily - 7 x weekly - 2 sets - 15 reps - 5 hold ?- Seated Figure 4 Piriformis Stretch  - 1 x daily - 7 x weekly - 2 sets - 5 reps - 20 hold ?- Clamshell  - 1 x daily - 7 x weekly - 3 sets - 10 reps ?- Supine Posterior Pelvic Tilt  - 1 x daily - 7 x weekly - 3 sets - 10 reps - 5 seconds hold ?- Modified Thomas Stretch  - 2-3 x daily - 7 x weekly - 1 sets - 3 reps - 30 seconds hold ?- Seated Transversus Abdominis Bracing  - 3 x daily - 7 x weekly - 3 sets - 10 reps - 5 seconds hold ? ?Seated hip extension isometrics  ? L 10x5 seconds for 3 sets ? ? ? ? PT Short Term Goals - 10/25/21 1524   ? ?  ? PT SHORT TERM GOAL #1  ? Title pt will be independent with HEP to improve hip flexibility, pain, and strength to return to PLOF   ? Baseline 10/25/21: initiated   ? Time 3    ? Period Weeks   ? Status New   ? Target Date 11/08/21   ? ?  ?  ? ?  ? ? ? PT Long Term Goals - 10/25/21 1524   ? ?  ? PT LONG TERM GOAL #1  ? Title Pt will improve FOTO to target score or greater to demo clinically significant improvement in functional mobility   ? Baseline 10/25/21: 54 with target score of 69   ? Time 6   ? Period Weeks   ? Status New   ? Target Date 12/06/21   ?  ? PT LONG TERM GOAL #2  ? Title  Pt will improve 5xSTS by at least 3 seconds to indicate clinically significant improvement in LE strength   ? Baseline 10/25/21: Next session   ? Time 6   ? Period Weeks   ? Status New   ? Target Date 12/06/21   ?  ? PT LONG TERM GOAL #3  ? Title Pt will demonstrate safe body mechanics and ergonomics with lifting, squatting, and push/pull ADL's to prevent risk of future MSK injury   ? Baseline 10/25/21: Needs f/u assessment on body mechanics and ergonomics in POC   ? Time 6   ? Period Weeks   ? Status New   ? Target Date 12/06/21   ?  ? PT LONG TERM GOAL #4  ? Title Pt will report </= 2/10 pain with sleep to demonstrate improvement in pain   ? Baseline 10/25/21: Up to 5/10 NPS with sleep and waking up in mornings   ? Time 6   ? Period Weeks   ? Status New   ? Target Date 12/06/21   ? ?  ?  ? ?  ? ? ? Plan - 11/20/21 1544   ? ? Clinical Impression Statement Pt making very good progress with decreased back and R posterior hip pain based on subjective reports. Continued working on improving posture, trunk and glute strength as well as thoracic extension to decrease stress to her low back. . Pt tolerated session well without aggravation of symptoms. Good muscle use observed with exercises. Pt will benefit from continued skilled physical therapy services to decrease pain, improve strength and function.   ? Personal Factors and Comorbidities Age;Time since onset of injury/illness/exacerbation;Comorbidity 3+;Past/Current Experience   ? Comorbidities Gill Bears Syndrome, IBS, anxiety, osteoporosis.   ?  Examination-Activity Limitations Lift;Bed Mobility;Bend;Sleep   ? Examination-Participation Restrictions Cleaning;Laundry;Community Activity   ? Stability/Clinical Decision Making Stable/Uncomplicated   ? Clinical Haywood Lasso

## 2021-11-22 ENCOUNTER — Ambulatory Visit: Payer: Medicare HMO

## 2021-11-27 ENCOUNTER — Ambulatory Visit: Payer: Medicare HMO

## 2021-11-27 DIAGNOSIS — M25551 Pain in right hip: Secondary | ICD-10-CM

## 2021-11-27 DIAGNOSIS — M62838 Other muscle spasm: Secondary | ICD-10-CM

## 2021-11-27 NOTE — Therapy (Signed)
?OUTPATIENT PHYSICAL THERAPY TREATMENT NOTE ?And ?Discharge Summary ? ? ? ?Patient Name: Denise Rhodes ?MRN: [id number] ?DOB:03-08-1941, 81 y.o., female ?Today's Date: 11/27/2021 ? ?PCP: Venia Carbon, MD ?REFERRING PROVIDER: Carmelia Roller, MD ? ? PT End of Session - 11/27/21 1504   ? ? Visit Number 7   ? Number of Visits 13   ? Date for PT Re-Evaluation 12/06/21   ? PT Start Time 3300   ? PT Stop Time 7622   ? PT Time Calculation (min) 40 min   ? Activity Tolerance Patient tolerated treatment well   ? Behavior During Therapy [facility] for tasks assessed/performed   ? ?  ?  ? ?  ? ? ? ? ? ? ? ?Past Medical History:  ?Diagnosis Date  ? Acute upper respiratory infections of unspecified site   ? Anxiety state, unspecified   ? Esophagitis 7/15  ? and gastritis on EGD-- Dr Gustavo Lah  ? Gilbert's syndrome   ? H/O: pneumonia 09/2006  ? IBS (irritable bowel syndrome)   ? Mitral valve disorders(424.0)   ? Osteoporosis, unspecified   ? Urinary tract infection, site not specified   ? ?Past Surgical History:  ?Procedure Laterality Date  ? Bladder tack    ? CATARACT EXTRACTION, BILATERAL  9/12, 1/13  ? MINOR EXCISION OF ORAL LESION    ? TOTAL ABDOMINAL HYSTERECTOMY W/ BILATERAL SALPINGOOPHORECTOMY  2000  ? Vaginectomy colpocleisis cysto  2/16  ? ?Patient Active Problem List  ? Diagnosis Date Noted  ? Lumbar back sprain 10/20/2021  ? Dizziness 05/24/2021  ? Recurrent UTI 03/20/2017  ? GERD (gastroesophageal reflux disease) 11/26/2016  ? Advance directive discussed with patient 11/16/2014  ? Atrial tachycardia ([place]) 06/10/2013  ? Routine general medical examination at a health care facility 11/21/2011  ? IBS (irritable bowel syndrome)   ? Episodic mood disorder ([place]) 03/20/2007  ? Mitral valve disorder 03/20/2007  ? Osteoporosis 03/20/2007  ? ? ?REFERRING DIAG: lumbar radiculopathy ? ?THERAPY DIAG:  ?Pain in right hip ? ?Other muscle spasm ? ?PERTINENT HISTORY: Pt is a 81 y.o. female referred to OP PT for LBP after an ED visit on  10/11/21 which MD's seem to believe is a musculoskeletal injury. PMH includes: Gill Bears Syndrome, IBS, anxiety, osteoporosis. Pt reports onset of LBP on March 8th early in hte morning which she attributes to cleaning the day prior that may have caused her pain. Reports pain slowly improving, its worst in the morning that improves with time and movement but worsening of pain in evenings after activit. Initially pain described at a 10/10 described as a muscle spasm. Now pain currently described as soreness, from R sided low back into hip that is constant ache. Heat helps relax more so than ice pack. Current worst pain since ED visit has been a 4-5/10 in mornings, 2-3/10 is best. Currently no LBP. Activities that worsen pain: Pt has a hard time describing painful movements besides bed mobility. Pt denies B/B changes, saddle anesthesia, fever, signifiacnt weight loss. Pt's major goal is to improve pain and prevent future issues and back pain. ? ?PRECAUTIONS: No known precautions ? ?SUBJECTIVE: Back and hip are feeling normal now. It's been nice. No pain. No pain for the past 7 days. Feels like she can graduate PT after today.  ? ? ? ?PAIN:  ?Are you having pain? No ? ? ?Objective measurements completed on examination: See above findings.  ? ? ?Standing posture: L lateral shift ? ? ?Therapeutic exercise ? ?Sit <>  stand 5x2 ? ?Ergonomic lifting 9.5 lb box, emphasis on increased use of hips and knees to protect back.  ? ? ?Reviewed progress/current status with PT towards goals  ? ?Seated hip extension isometrics  ? L 10x5 seconds for 3 sets ? Decreased R low back pressure observed. ?  ? ?Sitting with upright posture (to decrease R side bend pressure) with transversus abdominis contraction and scapular retraction B 10x3 with 5 second holds  ? ?Standing, bend over on table ? Hip extension  ?  R 5x3 ?  L 5x3 ? ?Seated trunk flexion to the L to decrease R low back pressure. 20 seconds x 2 ? ? ?SLS R LE with B UE assist 10x5  seconds for 2 sets ? ? ?Improved exercise technique, movement at target joints, use of target muscles after mod verbal, visual, tactile cues.  ? ? ? ? ? ?Response to treatment ?Pt tolerated session well without aggravation of symptoms.  ? ? ? ?Clinical impression ?Pt demonstrates significant decrease in low back pain, improved function, and functional strength since initial evaluation. Pt has made very good progress with PT towards goals. Skilled physical therapy services discharged with pt continuing progress with her exercises at home.  ? ? ? ? ? ? ? ?PATIENT EDUCATION: ?Education details: there-ex, HEP ?Person educated: Patient and Spouse ?Education method: Explanation, Demonstration, Tactile cues, Verbal cues, and Handouts ?Education comprehension: verbalized understanding and returned demonstration ? ? ?Access Code: [email or web address] ?URL: [email or web address] ?Date: 10/31/2021 ?Prepared by: Joneen Boers ? ?Exercises ?- Supine Single Knee to Chest Stretch  - 1 x daily - 7 x weekly - 2 sets - 15 reps - 5 hold ?- Seated Figure 4 Piriformis Stretch  - 1 x daily - 7 x weekly - 2 sets - 5 reps - 20 hold ?- Clamshell  - 1 x daily - 7 x weekly - 3 sets - 10 reps ?- Supine Posterior Pelvic Tilt  - 1 x daily - 7 x weekly - 3 sets - 10 reps - 5 seconds hold ?- Modified Thomas Stretch  - 2-3 x daily - 7 x weekly - 1 sets - 3 reps - 30 seconds hold ?- Seated Transversus Abdominis Bracing  - 3 x daily - 7 x weekly - 3 sets - 10 reps - 5 seconds hold ? ?Seated hip extension isometrics  ? L 10x5 seconds for 3 sets ? ? ? ? PT Short Term Goals - 11/27/21 1507   ? ?  ? PT SHORT TERM GOAL #1  ? Title pt will be independent with HEP to improve hip flexibility, pain, and strength to return to PLOF   ? Baseline 10/25/21: initiated; Has been doing her exercises, no questions (11/27/2021)   ? Time 3   ? Period Weeks   ? Status Achieved   ? Target Date 11/08/21   ? ?  ?  ? ?  ? ? ? PT Long Term Goals - 11/27/21 1508   ? ?  ? PT  LONG TERM GOAL #1  ? Title Pt will improve FOTO to target score or greater to demo clinically significant improvement in functional mobility   ? Baseline 10/25/21: 54 with target score of 69; 70 (11/20/2021)   ? Time 6   ? Period Weeks   ? Status Achieved   ? Target Date 12/06/21   ?  ? PT LONG TERM GOAL #2  ? Title Pt will improve 5xSTS by at least 3 seconds to indicate clinically significant improvement  in LE strength   ? Baseline 10/25/21: Next session; 10/31/2021:  15.66 seconds, no UE assist;   13 seconds, then 10 seconds  (11/27/2021)   ? Time 6   ? Period Weeks   ? Status Achieved   ? Target Date 12/06/21   ?  ? PT LONG TERM GOAL #3  ? Title Pt will demonstrate safe body mechanics and ergonomics with lifting, squatting, and push/pull ADL's to prevent risk of future MSK injury   ? Baseline 10/25/21: Needs f/u assessment on body mechanics and ergonomics in POC;  10/31/2021) Pt demnostrates increased use of back instead of B hip and knees lifting 9.5 lb box. (11/27/2021) Pt able to perform 9.5 lb box lift with good ergonomics.   ? Time 6   ? Period Weeks   ? Status Achieved   ? Target Date 12/06/21   ?  ? PT LONG TERM GOAL #4  ? Title Pt will report </= 2/10 pain with sleep to demonstrate improvement in pain   ? Baseline 10/25/21: Up to 5/10 NPS with sleep and waking up in mornings; No pain with sleeping (11/27/2021)   ? Time 6   ? Period Weeks   ? Status Achieved   ? Target Date 12/06/21   ? ?  ?  ? ?  ? ? ? Plan - 11/27/21 1537   ? ? Clinical Impression Statement Pt demonstrates significant decrease in low back pain, improved function, and functional strength since initial evaluation. Pt has made very good progress with PT towards goals. Skilled physical therapy services discharged with pt continuing progress with her exercises at home.   ? Personal Factors and Comorbidities Age;Time since onset of injury/illness/exacerbation;Comorbidity 3+;Past/Current Experience   ? Comorbidities Gill Bears Syndrome, IBS, anxiety,  osteoporosis.   ? Examination-Activity Limitations Lift;Bed Mobility;Bend;Sleep   ? Examination-Participation Restrictions Cleaning;Laundry;Community Activity   ? Stability/Clinical Decision Making Stable/[place]

## 2021-12-01 DIAGNOSIS — M5416 Radiculopathy, lumbar region: Secondary | ICD-10-CM | POA: Diagnosis not present

## 2022-02-21 ENCOUNTER — Other Ambulatory Visit: Payer: Self-pay | Admitting: Internal Medicine

## 2022-02-21 MED ORDER — ALPRAZOLAM 0.25 MG PO TABS: 0.2500 mg | ORAL_TABLET | Freq: Three times a day (TID) | ORAL | 0 refills | Status: DC | PRN

## 2022-02-21 NOTE — Telephone Encounter (Signed)
Last filled 04-12-21 #30 Last OV 10-20-21 Next OV 05-01-22 [place]

## 2022-02-21 NOTE — Telephone Encounter (Signed)
  Encourage patient to contact the pharmacy for refills or they can request refills through [facility]  Did the patient contact the pharmacy: Yes    LAST APPOINTMENT DATE: 10/20/21  NEXT APPOINTMENT DATE: 05/01/22  MEDICATION: ALPRAZolam (XANAX) 0.25 MG tablet  Is the patient out of medication? No  If not, how much is left? 3 left  Is this a 90 day supply:   PHARMACY: [facility] [address] [place]  Let patient know to contact pharmacy at the end of the day to make sure medication is ready.  Please notify patient to allow 48-72 hours to process

## 2022-03-20 DIAGNOSIS — B351 Tinea unguium: Secondary | ICD-10-CM | POA: Diagnosis not present

## 2022-03-20 DIAGNOSIS — M79674 Pain in right toe(s): Secondary | ICD-10-CM | POA: Diagnosis not present

## 2022-04-20 ENCOUNTER — Encounter: Payer: Medicare HMO | Admitting: Internal Medicine

## 2022-05-01 ENCOUNTER — Encounter: Payer: Self-pay | Admitting: Internal Medicine

## 2022-05-01 ENCOUNTER — Ambulatory Visit (INDEPENDENT_AMBULATORY_CARE_PROVIDER_SITE_OTHER): Payer: Medicare HMO | Admitting: Internal Medicine

## 2022-05-01 VITALS — BP 138/86 | HR 74 | Temp 97.6°F | Ht 63.75 in | Wt 152.0 lb

## 2022-05-01 DIAGNOSIS — K219 Gastro-esophageal reflux disease without esophagitis: Secondary | ICD-10-CM

## 2022-05-01 DIAGNOSIS — Z0000 Encounter for general adult medical examination without abnormal findings: Secondary | ICD-10-CM | POA: Diagnosis not present

## 2022-05-01 DIAGNOSIS — I4719 Other supraventricular tachycardia: Secondary | ICD-10-CM

## 2022-05-01 DIAGNOSIS — Z23 Encounter for immunization: Secondary | ICD-10-CM | POA: Diagnosis not present

## 2022-05-01 DIAGNOSIS — F39 Unspecified mood [affective] disorder: Secondary | ICD-10-CM | POA: Diagnosis not present

## 2022-05-01 DIAGNOSIS — I471 Supraventricular tachycardia: Secondary | ICD-10-CM | POA: Diagnosis not present

## 2022-05-01 DIAGNOSIS — N390 Urinary tract infection, site not specified: Secondary | ICD-10-CM

## 2022-05-01 DIAGNOSIS — R69 Illness, unspecified: Secondary | ICD-10-CM | POA: Diagnosis not present

## 2022-05-01 LAB — CBC
HCT: 40.8 % (ref 36.0–46.0)
Hemoglobin: 13.5 g/dL (ref 12.0–15.0)
MCHC: 33.1 g/dL (ref 30.0–36.0)
MCV: 89.3 fl (ref 78.0–100.0)
Platelets: 207 10*3/uL (ref 150.0–400.0)
RBC: 4.57 Mil/uL (ref 3.87–5.11)
RDW: 14 % (ref 11.5–15.5)
WBC: 6.5 10*3/uL (ref 4.0–10.5)

## 2022-05-01 LAB — TSH: TSH: 2.22 u[IU]/mL (ref 0.35–5.50)

## 2022-05-01 LAB — COMPREHENSIVE METABOLIC PANEL
ALT: 18 U/L (ref 0–35)
AST: 21 U/L (ref 0–37)
Albumin: 4.1 g/dL (ref 3.5–5.2)
Alkaline Phosphatase: 78 U/L (ref 39–117)
BUN: 14 mg/dL (ref 6–23)
CO2: 28 mEq/L (ref 19–32)
Calcium: 9.4 mg/dL (ref 8.4–10.5)
Chloride: 104 mEq/L (ref 96–112)
Creatinine, Ser: 0.67 mg/dL (ref 0.40–1.20)
GFR: 82.09 mL/min (ref >60.00)
Glucose, Bld: 94 mg/dL (ref 70–99)
Potassium: 4.1 mEq/L (ref 3.5–5.1)
Sodium: 139 mEq/L (ref 135–145)
Total Bilirubin: 1.4 mg/dL — ABNORMAL HIGH (ref 0.2–1.2)
Total Protein: 6.7 g/dL (ref 6.0–8.3)

## 2022-05-01 LAB — MAGNESIUM: Magnesium: 1.9 mg/dL (ref 1.5–2.5)

## 2022-05-01 MED ORDER — ESCITALOPRAM OXALATE 5 MG PO TABS: 5.0000 mg | ORAL_TABLET | Freq: Every day | ORAL | 1 refills | Status: DC

## 2022-05-01 NOTE — Assessment & Plan Note (Signed)
No recent symptoms on the metoprolol '25mg'$  daily

## 2022-05-01 NOTE — Addendum Note (Signed)
Addended by: Pilar Grammes on: 05/01/2022 03:42 PM   Modules accepted: Orders

## 2022-05-01 NOTE — Assessment & Plan Note (Signed)
I have personally reviewed the Medicare Annual Wellness questionnaire and have noted 1. The patient's medical and social history 2. Their use of alcohol, tobacco or illicit drugs 3. Their current medications and supplements 4. The patient's functional ability including ADL's, fall risks, home safety risks and hearing or visual             impairment. 5. Diet and physical activities 6. Evidence for depression or mood disorders  The patients weight, height, BMI and visual acuity have been recorded in the chart I have made referrals, counseling and provided education to the patient based review of the above and I have provided the pt with a written personalized care plan for preventive services.  I have provided you with a copy of your personalized plan for preventive services. Please take the time to review along with your updated medication list.  Walking now--encouraged her to continue No cancer screening due to age Flu vaccine today COVID soon--pharmacy Td/shingrix at pharmacy

## 2022-05-01 NOTE — Progress Notes (Signed)
Subjective:    Patient ID: Denise Rhodes, female    DOB: 04-Jul-1941, 81 y.o.   MRN: [id number]  HPI Here with husband for Medicare wellness visit and follow up of chronic health conditions Reviewed advanced directives Reviewed other doctors----Dr Gollan--cardiology, Dr Blima Dessert, Dr Venetia Constable, Dr Olivia Mackie, Dr Virgel Bouquet, Dr Georgina Pillion Has been walking again No hospitalizations or surgery Vision is fine No hearing problems No falls this year Not depressed---just anxiety No alcohol or tobacco Independent with instrumental ADLs No sig memory issues--some recall issues  Not feeling so good Gets "bad feeling that washes over me"---has to lie down Chin starts to quiver--no new (more often) Some lightheaded feelings Hard to concentrate--has trouble concentrating when Denise Rhodes is reading BP running slightly high at home--worst is 145/98. Can be 115/82 Feels better when Denise Rhodes walks Takes magnesium just prn for constipation--but loose lately--but thought Denise Rhodes might have low magnesium  Does feel more anxious---but not satisfied with the xanax  Not depression---not anhedonic  No palpitations No chest pain or SOB No syncope but did fall once---no persistent dizziness (the fall was over a year ago) No edema  No recent UTIs Off the estrogen cream--but takes cranberry Current Outpatient Medications on File Prior to Visit  Medication Sig Dispense Refill   Acetylcarnitine HCl (ACETYL L-CARNITINE) 500 MG CAPS Take by mouth.     acyclovir cream (ZOVIRAX) 5 % Apply 1 application topically every 3 (three) hours. 15 g 3   Alpha-Lipoic Acid 300 MG TABS Take 300 mg by mouth daily.     ALPRAZolam (XANAX) 0.25 MG tablet Take 1 tablet (0.25 mg total) by mouth 3 (three) times daily as needed. 30 tablet 0   Ascorbic Acid (VITAMIN C PO) Take by mouth.     Cranberry 400 MG CAPS Take by mouth.     metoprolol succinate (TOPROL-XL) 25 MG 24 hr tablet Take 1 tablet (25 mg total) by  mouth daily. 90 tablet 3   Pyridoxine HCl (VITAMIN B-6 PO) Take 600 mg by mouth daily. With NAC     VITAMIN D, CHOLECALCIFEROL, PO Take by mouth.     No current facility-administered medications on file prior to visit.    No Known Allergies  Past Medical History:  Diagnosis Date   Acute upper respiratory infections of unspecified site    Anxiety state, unspecified    Esophagitis 7/15   and gastritis on EGD-- Dr Gustavo Lah   Gilbert's syndrome    H/O: pneumonia 09/2006   IBS (irritable bowel syndrome)    Mitral valve disorders(424.0)    Osteoporosis, unspecified    Urinary tract infection, site not specified     Past Surgical History:  Procedure Laterality Date   Bladder tack     CATARACT EXTRACTION, BILATERAL  9/12, 1/13   MINOR EXCISION OF ORAL LESION     TOTAL ABDOMINAL HYSTERECTOMY W/ BILATERAL SALPINGOOPHORECTOMY  2000   Vaginectomy colpocleisis cysto  2/16    Family History  Problem Relation Age of Onset   Cancer Father        Lung   Cancer Mother        Colon   Hypertension Mother    Diabetes Other        Paternal side   Coronary artery disease Maternal Grandmother    Coronary artery disease Paternal Grandmother     Social History   Socioeconomic History   Marital status: Married    Spouse name: Not on file   Number of children: 3   Years  of education: Not on file   Highest education level: Not on file  Occupational History   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   Would not  want tube feeds if cognitively unaware   Social Determinants of Health   Financial Resource Strain: Not on file  Food Insecurity: Not on file  Transportation Needs: Not on file  Physical Activity: Not on file  Stress: Not on file  Social Connections: Not on file  Intimate Partner Violence: Not on file   Review of Systems Appetite is good Weight is stable Sleeps well---just occasional trouble initiating if anxious Wears seat belt Teeth are fine--goes every 3 months No suspicious skin lesions now Rare heartburn--just uses tums. No dysphagia Bowels move okay--occ upset stomach. No blood No sig back or joint pains now---back is better now after therapy     Objective:   Physical Exam Constitutional:      Appearance: Normal appearance.  HENT:     Mouth/Throat:     Comments: No lesions Eyes:     Conjunctiva/sclera: Conjunctivae normal.     Pupils: Pupils are equal, round, and reactive to light.  Cardiovascular:     Rate and Rhythm: Normal rate and regular rhythm.     Pulses: Normal pulses.     Heart sounds: No murmur heard.    No gallop.  Pulmonary:     Effort: Pulmonary effort is normal.     Breath sounds: Normal breath sounds. No wheezing or rales.  Abdominal:     Palpations: Abdomen is soft.     Tenderness: There is no abdominal tenderness.  Musculoskeletal:     Cervical back: Neck supple.     Right lower leg: No edema.     Left lower leg: No edema.  Lymphadenopathy:     Cervical: No cervical adenopathy.  Skin:    Findings: No lesion or rash.  Neurological:     General: No focal deficit present.     Mental Status: Denise Rhodes is alert and oriented to person, place, and time.     Comments: Mini-cog normal  Psychiatric:        Mood and Affect: Mood normal.        Behavior: Behavior normal.            Assessment & Plan:

## 2022-05-01 NOTE — Assessment & Plan Note (Signed)
Anxiety clearly worse Will try escitalopram '5mg'$  daily--discussed potential side effects

## 2022-05-01 NOTE — Assessment & Plan Note (Signed)
Just uses tums once in a while

## 2022-05-01 NOTE — Assessment & Plan Note (Signed)
Quiet on just the cranberry

## 2022-05-01 NOTE — Progress Notes (Signed)
Hearing Screening - Comments:: Passed whisper test Vision Screening - Comments:: December 2022  

## 2022-05-01 NOTE — Patient Instructions (Signed)
How to help anxiety - without medication.   1) Regular Exercise - walking, jogging, cycling, dancing, strength training   2)  Begin a Mindfulness/Meditation practice -- this can take a little as 3 minutes and is helpful for all kinds of mood issues  -- You can find resources in books  -- Or you can download apps like  ---- Headspace App (which currently has free content called "Weathering the Storm")  ---- Calm (which has a few free options)  ---- Insignt Timer  ---- Stop, Breathe & Think   # With each of these Apps - you should decline the "start free trial" offer and as you search through the App should be able to access some of their free content. You can also chose to pay for the content if you find one that works well for you.   # Many of them also offer sleep specific content which may help with insomnia   3) Healthy Diet  -- Avoid or decrease Caffeine  -- Avoid or decrease Alcohol  -- Drink plenty of water, have a balanced diet  -- Avoid cigarettes and marijuana (as well as other recreational drugs)   4) Consider contacting a professional therapist   

## 2022-05-09 ENCOUNTER — Telehealth: Payer: Self-pay | Admitting: Cardiovascular Disease

## 2022-05-09 NOTE — Telephone Encounter (Signed)
STAT if patient feels like he/she is going to faint   Are you dizzy now? No  Do you feel faint or have you passed out?   Do you have any other symptoms?   Have you checked your HR and BP (record if available)? 138/92 HR 69 136/87 HR 62  139/91 HR 88 120/83  150/93   Pt's husband says that his wife feels wobbly and lightheaded with dizziness and feels like she needs to steady herself. Pt's husband would like a call back to discuss this.

## 2022-05-09 NOTE — Telephone Encounter (Signed)
Spoke with patient's husband - who provided phone to wife. She reports lightheadedness/dizziness/not feeling well since May, off and on. This has gotten progressively worse over the last month. She has not passed out, but felt like she would pass out - describes an episode where her legs gave out and she collapsed to the floor. No h/o vertigo. No reported low BPs or low blood sugars. She has known atrial tachycardia - has not felt intense palpitations of late. Her last visit was >1 year ago. Will route to MD to review -- unsure if appointment can be moved sooner than 07/02/22  Routed to MD

## 2022-05-10 ENCOUNTER — Emergency Department
Admission: EM | Admit: 2022-05-10 | Discharge: 2022-05-11 | Disposition: A | Discharge status: Home / Self Care | Payer: Medicare HMO | Attending: Emergency Medicine | Admitting: Emergency Medicine

## 2022-05-10 ENCOUNTER — Emergency Department: Payer: Medicare HMO

## 2022-05-10 ENCOUNTER — Encounter: Payer: Self-pay | Admitting: *Deleted

## 2022-05-10 ENCOUNTER — Other Ambulatory Visit: Payer: Self-pay

## 2022-05-10 DIAGNOSIS — R55 Syncope and collapse: Secondary | ICD-10-CM | POA: Diagnosis not present

## 2022-05-10 LAB — CBC
HCT: 42.4 % (ref 36.0–46.0)
Hemoglobin: 13.5 g/dL (ref 12.0–15.0)
MCH: 28.5 pg (ref 26.0–34.0)
MCHC: 31.8 g/dL (ref 30.0–36.0)
MCV: 89.6 fL (ref 80.0–100.0)
Platelets: 234 10*3/uL (ref 150–400)
RBC: 4.73 MIL/uL (ref 3.87–5.11)
RDW: 13.7 % (ref 11.5–15.5)
WBC: 9.3 10*3/uL (ref 4.0–10.5)
nRBC: 0 % (ref 0.0–0.2)

## 2022-05-10 LAB — BASIC METABOLIC PANEL
Anion gap: 8 (ref 5–15)
BUN: 18 mg/dL (ref 8–23)
CO2: 25 mmol/L (ref 22–32)
Calcium: 9.4 mg/dL (ref 8.9–10.3)
Chloride: 106 mmol/L (ref 98–111)
Creatinine, Ser: 0.77 mg/dL (ref 0.44–1.00)
GFR, Estimated: >60 mL/min (ref >60)
Glucose, Bld: 108 mg/dL — ABNORMAL HIGH (ref 70–99)
Potassium: 3.8 mmol/L (ref 3.5–5.1)
Sodium: 139 mmol/L (ref 135–145)

## 2022-05-10 LAB — TROPONIN I (HIGH SENSITIVITY)
Troponin I (High Sensitivity): 5 ng/L (ref <18)
Troponin I (High Sensitivity): 5 ng/L (ref <18)

## 2022-05-10 NOTE — ED Provider Notes (Signed)
[facility] Provider Note    Event Date/Time   First MD Initiated Contact with Patient 05/10/22 2341     (approximate)   History   Near Syncope   HPI  OBIE SILOS is a 81 y.o. female who presents to the ED for evaluation of Near Syncope   I reviewed routine PCP visit from 9/26.  History of anxiety and empirically started on SSRI on 9/26. Also review a telephone note from yesterday where patient called into her cardiologist office complaining of intermittent not feeling well/presyncope on and off since May.  Patient presents to the ED for evaluation of an episode of flushing, presyncope while she was sitting on the toilet trying to pass a bowel movement.  Lasted about 15 minutes and self resolved and has not recurred.  Reports that this syndrome was distinctly different than her intermittent and chronic malaise.  No actual syncope or any falls.  No recurrence of symptoms.  Feels fine now but one of her friends who visited later this afternoon told her she should get checked out to get an EKG because she might be having a heart attack.  Physical Exam   Triage Vital Signs: ED Triage Vitals  Enc Vitals Group     BP 05/10/22 1813 (!) 188/100     Pulse Rate 05/10/22 1813 66     Resp 05/10/22 1813 18     Temp 05/10/22 1813 98.3 F (36.8 C)     Temp Source 05/10/22 1813 Oral     SpO2 05/10/22 1813 99 %     Weight 05/10/22 1811 150 lb (68 kg)     Height 05/10/22 1811 '5\' 3"'$  (1.6 m)     Head Circumference --      Peak Flow --      Pain Score --      Pain Loc --      Pain Edu? --      Excl. in [place]? --     Most recent vital signs: Vitals:   05/10/22 2125 05/11/22 0028  BP: (!) 162/88 (!) 145/80  Pulse: 76 77  Resp: 18 18  Temp:  98 F (36.7 C)  SpO2: 95% 96%    General: Awake, no distress.  Ambulatory with normal gait.  Well-appearing. CV:  Good peripheral perfusion.  Resp:  Normal effort.  Abd:  No distention.  MSK:  No deformity noted.   Neuro:  No focal deficits appreciated. Cranial nerves II through XII intact 5/5 strength and sensation in all 4 extremities Other:     ED Results / Procedures / Treatments   Labs (all labs ordered are listed, but only abnormal results are displayed) Labs Reviewed  BASIC METABOLIC PANEL - Abnormal; Notable for the following components:      Result Value   Glucose, Bld 108 (*)    All other components within normal limits  CBC  TROPONIN I (HIGH SENSITIVITY)  TROPONIN I (HIGH SENSITIVITY)    EKG Sinus rhythm with a rate of 71 bpm.  Normal axis and intervals.  No presents of acute ischemia.  RADIOLOGY CXR interpreted by me without evidence of acute cardiopulmonary pathology.  Official radiology report(s): DG Chest 2 View  Result Date: 05/10/2022 CLINICAL DATA:  Syncope EXAM: CHEST - 2 VIEW COMPARISON:  None Available. FINDINGS: The heart size and mediastinal contours are within normal limits. There is scarring in the lung apices. The lungs are otherwise clear. There is no pleural effusion or pneumothorax. The visualized  skeletal structures are unremarkable. IMPRESSION: No active cardiopulmonary disease. Electronically Signed   By: Ronney Asters M.D.   On: 05/10/2022 19:02    PROCEDURES and INTERVENTIONS:  .1-3 Lead EKG Interpretation  Performed by: Vladimir Crofts, MD Authorized by: Vladimir Crofts, MD     Interpretation: normal     ECG rate:  80   ECG rate assessment: normal     Rhythm: sinus rhythm     Ectopy: none     Conduction: normal     Medications - No data to display   IMPRESSION / MDM / [place] / ED COURSE  I reviewed the triage vital signs and the nursing notes.  Differential diagnosis includes, but is not limited to, vasovagal syndrome, TIA, stroke, dehydration, cardiac dysrhythmia  {Patient presents with symptoms of an acute illness or injury that is potentially life-threatening.  81 year old female presents to the ED after a transient episode of  presyncope and flushed sensation while trying to pass a bowel movement, likely vasovagal syndrome suitable for outpatient management.  She is asymptomatic here in the ED and has not had any recurrence of symptoms.  Reassuring examination and work-up.  CBC, BMP and troponin x2 are normal.  CXR is clear and EKG without concerning interval changes.  I considered observation admission for this patient, but ultimately such a benign work-up, asymptomatic while she has been here and I think outpatient management is reasonable.  We discussed return precautions.      FINAL CLINICAL IMPRESSION(S) / ED DIAGNOSES   Final diagnoses:  Vasovagal episode     Rx / DC Orders   ED Discharge Orders     None        Note:  This document was prepared using Dragon voice recognition software and may include unintentional dictation errors.   Vladimir Crofts, MD 05/11/22 0201

## 2022-05-10 NOTE — ED Triage Notes (Addendum)
Pt to triage via wheelchair.  Pt reports having a BM and became flushed.   Pt reports hx tachycardia.   No n/v/d  no chest pain  no sob.  No diaphoresis.  Pt denies weakness or dizziness.  Pt alert  speech clear.   Pt reports taking lexapro for 4 days and it made her feel bad, so she stopped taking it.

## 2022-05-11 NOTE — Telephone Encounter (Signed)
Husband was returning call. Please advise

## 2022-05-11 NOTE — Discharge Instructions (Addendum)
As we discussed, I suspect that her episode was more related to the pushing while on the toilet than anything to do with your heart.  Also less likely to be related to the Lexapro that you just started.  It would be reasonable to discuss with your PCP about continuing that medication.  Return to the ED with any worsening symptoms.

## 2022-05-11 NOTE — Telephone Encounter (Signed)
Left message for patient's husband to call back. Looks like patient went to the ER yesterday.

## 2022-05-14 NOTE — Telephone Encounter (Addendum)
Spoke w/ pt's husband. He reports that pt was recently in the ED for vasovagal episode and all tests came back WNL. He reports that most of pt's sx are r/t to straining during/after BMs.  Advised him to try to incorporate more fiber into pt's diet and try fiber supplements and/or probiotics to try to avoid straining.  He reports that she has had GI issues since he met her.  He reports that pt has quite a bit of anxiety; when she is anxious her BP has gotten as high as 161/94. He would like to discuss a possible heart monitor at her upcoming appt  He is not somewhere to write anything down, so asks that I send that info via [place].  He is appreciative and will call back w/ any further questions or concerns.

## 2022-05-14 NOTE — Telephone Encounter (Signed)
Patient's husband returning call.

## 2022-05-14 NOTE — Telephone Encounter (Signed)
Pt went to ED for vasovagal episode. Left message for husband asking him to call us if we can be of further assistance.

## 2022-05-16 ENCOUNTER — Telehealth: Payer: Self-pay

## 2022-05-16 NOTE — Telephone Encounter (Signed)
        Patient  visited [place] on 10/6    Telephone encounter attempt :    A HIPAA compliant voice message was left requesting a return call.  Instructed patient to call back     [place], [facility]  [phone] [address], [place], [place] [address] Phone: [phone] Email: [email or web address]

## 2022-05-22 ENCOUNTER — Ambulatory Visit: Payer: Medicare HMO | Attending: Cardiovascular Disease | Admitting: Cardiovascular Disease

## 2022-05-22 ENCOUNTER — Encounter: Payer: Self-pay | Admitting: Cardiovascular Disease

## 2022-05-22 ENCOUNTER — Ambulatory Visit: Payer: Medicare HMO | Attending: Cardiovascular Disease

## 2022-05-22 VITALS — BP 150/92 | HR 72 | Ht 64.0 in | Wt 155.0 lb

## 2022-05-22 DIAGNOSIS — R002 Palpitations: Secondary | ICD-10-CM

## 2022-05-22 DIAGNOSIS — I4719 Other supraventricular tachycardia: Secondary | ICD-10-CM | POA: Diagnosis not present

## 2022-05-22 DIAGNOSIS — R55 Syncope and collapse: Secondary | ICD-10-CM

## 2022-05-22 NOTE — Patient Instructions (Addendum)
Medication Instructions:  No changes  If you need a refill on your cardiac medications before your next appointment, please call your pharmacy.   Lab work: No new labs needed  Testing/Procedures: Zio monitor for near syncope Your physician has recommended that you wear a Zio monitor.   This monitor is a medical device that records the heart's electrical activity. Doctors most often use these monitors to diagnose arrhythmias. Arrhythmias are problems with the speed or rhythm of the heartbeat. The monitor is a small device applied to your chest. You can wear one while you do your normal daily activities. While wearing this monitor if you have any symptoms to push the button and record what you felt. Once you have worn this monitor for the period of time provider prescribed (Usually 14 days), you will return the monitor device in the postage paid box. Once it is returned they will download the data collected and provide [place] with a report which the provider will then review and we will call you with those results. Important tips:  Avoid showering during the first 24 hours of wearing the monitor. Avoid excessive sweating to help maximize wear time. Do not submerge the device, no hot tubs, and no swimming pools. Keep any lotions or oils away from the patch. After 24 hours you may shower with the patch on. Take brief showers with your back facing the shower head.  Do not remove patch once it has been placed because that will interrupt data and decrease adhesive wear time. Push the button when you have any symptoms and write down what you were feeling. Once you have completed wearing your monitor, remove and place into box which has postage paid and place in your outgoing mailbox.  If for some reason you have misplaced your box then call our office and we can provide another box and/or mail it off for you.  Follow-Up: At [facility], you and your health needs are our priority.  As part of our  continuing mission to provide you with exceptional heart care, we have created designated Provider Care Teams.  These Care Teams include your primary Cardiologist (physician) and Advanced Practice Providers (APPs -  Physician Assistants and Nurse Practitioners) who all work together to provide you with the care you need, when you need it.  You will need a follow up appointment as needed  Providers on your designated Care Team:   Murray Hodgkins, NP Christell Faith, PA-C Cadence Kathlen Mody, [place]  COVID-19 Vaccine Information can be found at: [email or web address] For questions related to vaccine distribution or appointments, please email [email or web address] or call [phone].

## 2022-05-22 NOTE — Progress Notes (Signed)
Cardiology Office Note  Date:  05/22/2022   ID:  Denise Rhodes, DOB [birth year], MRN [id number]  PCP:  Venia Carbon, MD   Chief Complaint  Patient presents with   [facility] follow up     Patient c/o balance being off, leg weakness, tachycardia at times. Medications reviewed by the patient verbally.     HPI:  Denise Rhodes is a 81 y.o. female w/ PMHx  anxiety, Insomnia IBS  palpitations 48 hour Holter monitor:  runs of atrial tachycardia, frequent APCs, bigeminal pattern. possible mitral valve prolapse Echocardiogram 2013, normal ejection fraction She presents today for follow-up of her palpitations  Last seen in clinic by myself September 2022  Seen in the emergency room May 10, 2022 Had vasovagal spell while having bowel movement, was able to lay down and recover on her bed Was not having symptoms, neighbor told her to go to the ER make sure she was not having a heart attack Work-up in the ER was benign  Starting in May 2023 Gets episodes "strange sensations" in her legs , feels unstable, weak At least once a week is aims to be happening Ignores it now, just keeps on doing what she is trying to do such as housework Sometimes sits to recover  Some anxiety/stress Xanax for anxiety, 2x a week, seems to work well, sleeps better Started doing walking with husband but not much Right her exercise program Helps take care of grandchildren  Sedentary, does some house work  Did not tolerate lexapro tried 4 days,   EKG personally reviewed by myself on todays visit Normal sinus rhythm rate 72 bpm no significant ST or T wave changes  Other past medical history reviewed 04/17/21: had just got up, Fell over dinning room table Etiology unclear Landed on the ground, minor injury No LOC BP was elevated Husband picked her up  Prior monitor showed runs of atrial tachycardia, frequent APCs, sometimes in a bigeminal pattern.   Previous echo indicated EF 55-60%, grade 1  diastolic dysfunction, structurally normal MV with normal leaflet separation, trivial MR.   PMH:   has a past medical history of Acute upper respiratory infections of unspecified site, Anxiety state, unspecified, Esophagitis (7/15), Gilbert's syndrome, H/O: pneumonia (09/2006), IBS (irritable bowel syndrome), Mitral valve disorders(424.0), Osteoporosis, unspecified, and Urinary tract infection, site not specified.  PSH:    Past Surgical History:  Procedure Laterality Date   Bladder tack     CATARACT EXTRACTION, BILATERAL  9/12, 1/13   MINOR EXCISION OF ORAL LESION     TOTAL ABDOMINAL HYSTERECTOMY W/ BILATERAL SALPINGOOPHORECTOMY  2000   Vaginectomy colpocleisis cysto  2/16    Current Outpatient Medications  Medication Sig Dispense Refill   Acetylcarnitine HCl (ACETYL L-CARNITINE) 500 MG CAPS Take by mouth.     acyclovir cream (ZOVIRAX) 5 % Apply 1 application topically every 3 (three) hours. 15 g 3   Alpha-Lipoic Acid 300 MG TABS Take 300 mg by mouth daily.     ALPRAZolam (XANAX) 0.25 MG tablet Take 1 tablet (0.25 mg total) by mouth 3 (three) times daily as needed. 30 tablet 0   Ascorbic Acid (VITAMIN C PO) Take by mouth.     Cranberry 400 MG CAPS Take by mouth.     metoprolol succinate (TOPROL-XL) 25 MG 24 hr tablet Take 1 tablet (25 mg total) by mouth daily. 90 tablet 3   Pyridoxine HCl (VITAMIN B-6 PO) Take 600 mg by mouth daily. With NAC     VITAMIN D, CHOLECALCIFEROL,  PO Take by mouth.     escitalopram (LEXAPRO) 5 MG tablet Take 1 tablet (5 mg total) by mouth daily. (Patient not taking: Reported on 05/22/2022) 90 tablet 1   No current facility-administered medications for this visit.    Allergies:   Patient has no known allergies.   Social History:  The patient  reports that she has never smoked. She has never been exposed to tobacco smoke. She has never used smokeless tobacco. She reports that she does not drink alcohol and does not use drugs.   Family History:   family history  includes Cancer in her father and mother; Coronary artery disease in her maternal grandmother and paternal grandmother; Diabetes in an other family member; Hypertension in her mother.    Review of Systems: Review of Systems  Constitutional: Negative.   HENT: Negative.    Respiratory: Negative.    Cardiovascular: Negative.   Gastrointestinal: Negative.   Musculoskeletal: Negative.   Neurological: Negative.   Psychiatric/Behavioral: Negative.    All other systems reviewed and are negative.  PHYSICAL EXAM: VS:  BP (!) 150/92 (BP Location: Left Arm, Patient Position: Sitting, Cuff Size: Normal)   Pulse 72   Ht '5\' 4"'$  (1.626 m)   Wt 155 lb (70.3 kg)   SpO2 97%   BMI 26.61 kg/m  , BMI Body mass index is 26.61 kg/m. Constitutional:  oriented to person, place, and time. No distress.  HENT:  Head: Grossly normal Eyes:  no discharge. No scleral icterus.  Neck: No JVD, no carotid bruits  Cardiovascular: Regular rate and rhythm, no murmurs appreciated Pulmonary/Chest: Clear to auscultation bilaterally, no wheezes or rails Abdominal: Soft.  no distension.  no tenderness.  Musculoskeletal: Normal range of motion Neurological:  normal muscle tone. Coordination normal. No atrophy Skin: Skin warm and dry Psychiatric: normal affect, pleasant  Recent Labs: 05/01/2022: ALT 18; Magnesium 1.9; TSH 2.22 05/10/2022: BUN 18; Creatinine, Ser 0.77; Hemoglobin 13.5; Platelets 234; Potassium 3.8; Sodium 139    Lipid Panel Lab Results  Component Value Date   CHOL 164 11/16/2014   HDL 61.40 11/16/2014   LDLCALC 88 11/16/2014   TRIG 71.0 11/16/2014    Wt Readings from Last 3 Encounters:  05/22/22 155 lb (70.3 kg)  05/10/22 150 lb (68 kg)  05/01/22 152 lb (68.9 kg)     ASSESSMENT AND PLAN:  Atrial tachycardia (HCC)  Recommend she continue current dose of metoprolol succinate 25 daily Given episodes of weakness, leg instability,, " waves of not feeling well" happening on a weekly basis,  repeat Zio monitor ordered to rule out arrhythmia  Fall No recent episodes of fall or syncope ZIO monitor to exclude arrhythmia as above  Anxiety Recommended exercise program,  Stress reduction techniques discussed Xanax as needed seems to be working well Did not take Lexapro, tried for 4 days at very low-dose  Essential hypertension Blood pressure is well controlled on today's visit. No changes made to the medications.    Total encounter time more than 30 minutes  Greater than 50% was spent in counseling and coordination of care with the patient    Orders Placed This Encounter  Procedures   LONG TERM MONITOR (3-14 DAYS)   EKG 12-Lead      Signed, Esmond Plants, M.D., Ph.D. 05/22/2022  [place], [place]

## 2022-05-25 DIAGNOSIS — R002 Palpitations: Secondary | ICD-10-CM | POA: Diagnosis not present

## 2022-05-25 DIAGNOSIS — R55 Syncope and collapse: Secondary | ICD-10-CM | POA: Diagnosis not present

## 2022-05-25 DIAGNOSIS — I4719 Other supraventricular tachycardia: Secondary | ICD-10-CM | POA: Diagnosis not present

## 2022-05-31 ENCOUNTER — Other Ambulatory Visit: Payer: Self-pay | Admitting: Cardiovascular Disease

## 2022-05-31 NOTE — Telephone Encounter (Signed)
Rx refill sent to pharmacy. 

## 2022-06-18 DIAGNOSIS — R002 Palpitations: Secondary | ICD-10-CM | POA: Diagnosis not present

## 2022-06-18 DIAGNOSIS — R55 Syncope and collapse: Secondary | ICD-10-CM | POA: Diagnosis not present

## 2022-06-19 ENCOUNTER — Ambulatory Visit: Payer: Medicare HMO | Admitting: Internal Medicine

## 2022-07-02 ENCOUNTER — Ambulatory Visit: Payer: Medicare HMO | Admitting: Cardiovascular Disease

## 2022-07-05 ENCOUNTER — Telehealth: Payer: Medicare HMO | Admitting: Internal Medicine

## 2022-07-06 NOTE — Assessment & Plan Note (Signed)
Improved so visit cancelled

## 2022-07-06 NOTE — Progress Notes (Signed)
Visit cancelled.

## 2022-08-03 DIAGNOSIS — Z20818 Contact with and (suspected) exposure to other bacterial communicable diseases: Secondary | ICD-10-CM | POA: Diagnosis not present

## 2022-10-04 DIAGNOSIS — Z961 Presence of intraocular lens: Secondary | ICD-10-CM | POA: Diagnosis not present

## 2022-10-04 DIAGNOSIS — H52223 Regular astigmatism, bilateral: Secondary | ICD-10-CM | POA: Diagnosis not present

## 2022-10-04 DIAGNOSIS — H11003 Unspecified pterygium of eye, bilateral: Secondary | ICD-10-CM | POA: Diagnosis not present

## 2022-10-04 DIAGNOSIS — H43813 Vitreous degeneration, bilateral: Secondary | ICD-10-CM | POA: Diagnosis not present

## 2022-10-11 DIAGNOSIS — Z0100 Encounter for examination of eyes and vision without abnormal findings: Secondary | ICD-10-CM | POA: Diagnosis not present

## 2022-11-06 ENCOUNTER — Other Ambulatory Visit: Payer: Self-pay | Admitting: Internal Medicine

## 2022-11-06 NOTE — Telephone Encounter (Addendum)
Last filled 02-21-22 #30 Last OV Acute 07-05-22 No Future [address] [place]

## 2023-03-05 DIAGNOSIS — H6123 Impacted cerumen, bilateral: Secondary | ICD-10-CM | POA: Diagnosis not present

## 2023-03-05 DIAGNOSIS — H903 Sensorineural hearing loss, bilateral: Secondary | ICD-10-CM | POA: Diagnosis not present

## 2023-05-07 ENCOUNTER — Encounter: Payer: Self-pay | Admitting: Internal Medicine

## 2023-05-07 ENCOUNTER — Ambulatory Visit: Payer: Medicare HMO | Admitting: Internal Medicine

## 2023-05-07 VITALS — BP 138/86 | HR 65 | Temp 98.1°F | Ht 63.75 in | Wt 152.0 lb

## 2023-05-07 DIAGNOSIS — I4719 Other supraventricular tachycardia: Secondary | ICD-10-CM

## 2023-05-07 DIAGNOSIS — Z0000 Encounter for general adult medical examination without abnormal findings: Secondary | ICD-10-CM | POA: Diagnosis not present

## 2023-05-07 DIAGNOSIS — K581 Irritable bowel syndrome with constipation: Secondary | ICD-10-CM | POA: Diagnosis not present

## 2023-05-07 DIAGNOSIS — F39 Unspecified mood [affective] disorder: Secondary | ICD-10-CM

## 2023-05-07 DIAGNOSIS — Z23 Encounter for immunization: Secondary | ICD-10-CM | POA: Diagnosis not present

## 2023-05-07 DIAGNOSIS — K219 Gastro-esophageal reflux disease without esophagitis: Secondary | ICD-10-CM | POA: Diagnosis not present

## 2023-05-07 LAB — TSH: TSH: 2.7 u[IU]/mL (ref 0.35–5.50)

## 2023-05-07 LAB — COMPREHENSIVE METABOLIC PANEL
ALT: 14 U/L (ref 0–35)
AST: 19 U/L (ref 0–37)
Albumin: 4.1 g/dL (ref 3.5–5.2)
Alkaline Phosphatase: 83 U/L (ref 39–117)
BUN: 16 mg/dL (ref 6–23)
CO2: 30 meq/L (ref 19–32)
Calcium: 9.6 mg/dL (ref 8.4–10.5)
Chloride: 105 meq/L (ref 96–112)
Creatinine, Ser: 0.74 mg/dL (ref 0.40–1.20)
GFR: 75.44 mL/min (ref >60.00)
Glucose, Bld: 90 mg/dL (ref 70–99)
Potassium: 4.7 meq/L (ref 3.5–5.1)
Sodium: 142 meq/L (ref 135–145)
Total Bilirubin: 1.5 mg/dL — ABNORMAL HIGH (ref 0.2–1.2)
Total Protein: 6.6 g/dL (ref 6.0–8.3)

## 2023-05-07 LAB — CBC
HCT: 43.7 % (ref 36.0–46.0)
Hemoglobin: 14 g/dL (ref 12.0–15.0)
MCHC: 32.1 g/dL (ref 30.0–36.0)
MCV: 90.4 fL (ref 78.0–100.0)
Platelets: 228 10*3/uL (ref 150.0–400.0)
RBC: 4.84 Mil/uL (ref 3.87–5.11)
RDW: 14.3 % (ref 11.5–15.5)
WBC: 7.4 10*3/uL (ref 4.0–10.5)

## 2023-05-07 MED ORDER — ALPRAZOLAM 0.25 MG PO TABS: 0.2500 mg | ORAL_TABLET | Freq: Three times a day (TID) | ORAL | 0 refills | Status: AC | PRN

## 2023-05-07 NOTE — Assessment & Plan Note (Signed)
No symptoms since on metoprolol 25mg  daily

## 2023-05-07 NOTE — Assessment & Plan Note (Signed)
Mild  Hasn't needed meds

## 2023-05-07 NOTE — Progress Notes (Signed)
Hearing Screening - Comments:: August 2024 Vision Screening - Comments:: December 2023

## 2023-05-07 NOTE — Assessment & Plan Note (Signed)
Has regimen to control constipation

## 2023-05-07 NOTE — Assessment & Plan Note (Signed)
I have personally reviewed the Medicare Annual Wellness questionnaire and have noted 1. The patient's medical and social history 2. Their use of alcohol, tobacco or illicit drugs 3. Their current medications and supplements 4. The patient's functional ability including ADL's, fall risks, home safety risks and hearing or visual             impairment. 5. Diet and physical activities 6. Evidence for depression or mood disorders  The patients weight, height, BMI and visual acuity have been recorded in the chart I have made referrals, counseling and provided education to the patient based review of the above and I have provided the pt with a written personalized care plan for preventive services.  I have provided you with a copy of your personalized plan for preventive services. Please take the time to review along with your updated medication list.  Healthy Trying to do more exercise No cancer screening due to age Flu vaccine today COVID and one time RSV at pharmacy Due for Td and consider shingrix at pharmacy

## 2023-05-07 NOTE — Progress Notes (Signed)
Subjective:    Patient ID: Denise Rhodes, female    DOB: 04/13/1941, 82 y.o.   MRN: [id number]  HPI Here with husband for Medicare wellness visit and follow up of chronic health conditions Reviewed advanced directives Reviewed other doctors---Dr McQueen--ENT, Dr Thomasene Lot, Dr Gollan--cardiology, Dr Moshe Cipro, Dr Cleatis Polka, Dr Leontine Locket, Dr Acquanetta Belling No hospitalizations or surgery in the past year---just one ER visit Vision is okay--may go back to eye doctor due to trouble with new progressive lens Hearing is fair--not quite ready for aides Has been walking--trying to stay with it No falls Mild chronic depression---no worse. Not anhedonic Independent with instrumental ADLs Mild memory issues--no loss of function  Constipation is worse Keeps bowels moving with probiotic---and prn magnesium (once a week or so)  Had episode of vagal dizziness on the toilet Hot, etc Seen in ER--but able to be discharged No palpitations on the metoprolol No chest pain or SOB No syncope. Occasional brief lightheaded feeling No sig edema  Didn't do well with escitalopram after last year's visit Felt odd Just uses the xanax prn and it helps for her anxiety  Slight heartburn---hasn't needed meds No dysphagia  Current Outpatient Medications on File Prior to Visit  Medication Sig Dispense Refill   Acetylcysteine (NAC PO) Take by mouth.     ALPRAZolam (XANAX) 0.25 MG tablet Take 1 tablet by mouth three times daily as needed 30 tablet 0   Cranberry 400 MG CAPS Take by mouth.     metoprolol succinate (TOPROL-XL) 25 MG 24 hr tablet Take 1 tablet by mouth once daily 90 tablet 3   Multiple Vitamin (MULTIVITAMIN WITH MINERALS) TABS tablet Take 1 tablet by mouth daily.     No current facility-administered medications on file prior to visit.    No Known Allergies  Past Medical History:  Diagnosis Date   Acute upper respiratory infections of unspecified site    Anxiety  state, unspecified    Esophagitis 7/15   and gastritis on EGD-- Dr Marva Panda   Gilbert's syndrome    H/O: pneumonia 09/2006   IBS (irritable bowel syndrome)    Mitral valve disorders(424.0)    Osteoporosis, unspecified    Urinary tract infection, site not specified     Past Surgical History:  Procedure Laterality Date   Bladder tack     CATARACT EXTRACTION, BILATERAL  9/12, 1/13   MINOR EXCISION OF ORAL LESION     TOTAL ABDOMINAL HYSTERECTOMY W/ BILATERAL SALPINGOOPHORECTOMY  2000   Vaginectomy colpocleisis cysto  2/16    Family History  Problem Relation Age of Onset   Cancer Father        Lung   Cancer Mother        Colon   Hypertension Mother    Diabetes Other        Paternal side   Coronary artery disease Maternal Grandmother    Coronary artery disease Paternal Grandmother     Social History   Socioeconomic History   Marital status: Married    Spouse name: Not on file   Number of children: 3   Years of education: Not on file   Highest education level: Not on file  Occupational History   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   Would not want tube feeds if cognitively unaware   Social Determinants of Health   Financial Resource Strain: Not on file  Food Insecurity: Not on file  Transportation Needs: Not on file  Physical Activity: Not on file  Stress: Not on file  Social Connections: Not on file  Intimate Partner Violence: Not on file   Review of Systems Appetite is  good Weight is stable Sleeps okay in general Wears seat belt Teeth are okay---keeps up with dentist No sig back or joint pains--no recent sciatica No suspicious skin lesions    Objective:   Physical Exam Constitutional:      Appearance: Normal appearance.  HENT:     Mouth/Throat:     Pharynx: No oropharyngeal exudate or posterior oropharyngeal erythema.  Eyes:     Conjunctiva/sclera: Conjunctivae normal.     Pupils: Pupils are equal, round, and reactive to light.  Cardiovascular:     Rate and Rhythm: Normal rate and regular rhythm.     Pulses: Normal pulses.     Heart sounds: No murmur heard.    No gallop.  Pulmonary:     Effort: Pulmonary effort is normal.     Breath sounds: Normal breath sounds. No wheezing or rales.  Abdominal:     Palpations: Abdomen is soft.     Tenderness: There is no abdominal tenderness.  Musculoskeletal:     Cervical back: Neck supple.     Right lower leg: No edema.     Left lower leg: No edema.  Lymphadenopathy:     Cervical: No cervical adenopathy.  Skin:    Findings: No rash.  Neurological:     General: No focal deficit present.     Mental Status: She is alert and oriented to person, place, and time.     Comments: Word naming---8/1 minutes Recall 3/3  Psychiatric:        Mood and Affect: Mood normal.        Behavior: Behavior normal.            Assessment & Plan:

## 2023-05-07 NOTE — Assessment & Plan Note (Signed)
Chronic anxiety and some dysthymia (not much) Didn't do well with escitalopram Only uses alprazolam rarrely--will refill

## 2023-05-22 ENCOUNTER — Telehealth: Payer: Self-pay | Admitting: Cardiovascular Disease

## 2023-05-22 ENCOUNTER — Other Ambulatory Visit: Payer: Self-pay | Admitting: Cardiovascular Disease

## 2023-05-22 NOTE — Telephone Encounter (Signed)
Good Morning,  Could you schedule this patient a yearly follow up? The patient was last seen by Dr. Mariah Milling on 05-22-22 and he states that she could follow up as needed. The patient also has the option to request refills with her PCP. Thank you so much.

## 2023-05-22 NOTE — Telephone Encounter (Signed)
Left voicemail, The patient was last seen by Dr. Mariah Milling on 05-22-22 and he states that she could follow up as needed. The patient also has the option to request refills with her PCP. Thank you so much.

## 2023-05-22 NOTE — Telephone Encounter (Signed)
Left voice mail

## 2023-05-22 NOTE — Telephone Encounter (Signed)
Pt is going to get meds refilled through her PCP

## 2023-06-11 DIAGNOSIS — Z1231 Encounter for screening mammogram for malignant neoplasm of breast: Secondary | ICD-10-CM | POA: Diagnosis not present

## 2023-06-11 LAB — HM MAMMOGRAPHY

## 2023-06-14 ENCOUNTER — Other Ambulatory Visit: Payer: Self-pay | Admitting: Cardiovascular Disease

## 2023-07-18 ENCOUNTER — Ambulatory Visit: Payer: Medicare HMO | Admitting: Internal Medicine

## 2023-07-25 ENCOUNTER — Ambulatory Visit: Payer: Medicare HMO | Admitting: Internal Medicine

## 2023-08-12 ENCOUNTER — Ambulatory Visit: Payer: Medicare HMO | Admitting: Internal Medicine

## 2024-05-01 ENCOUNTER — Ambulatory Visit (INDEPENDENT_AMBULATORY_CARE_PROVIDER_SITE_OTHER)

## 2024-05-01 VITALS — BP 138/82 | HR 68 | Temp 98.1°F | Ht 63.75 in | Wt 163.1 lb

## 2024-05-01 DIAGNOSIS — F419 Anxiety disorder, unspecified: Secondary | ICD-10-CM | POA: Diagnosis not present

## 2024-05-01 DIAGNOSIS — N951 Menopausal and female climacteric states: Secondary | ICD-10-CM | POA: Diagnosis not present

## 2024-05-01 DIAGNOSIS — M816 Localized osteoporosis [Lequesne]: Secondary | ICD-10-CM | POA: Diagnosis not present

## 2024-05-01 DIAGNOSIS — Z23 Encounter for immunization: Secondary | ICD-10-CM | POA: Diagnosis not present

## 2024-05-01 LAB — VITAMIN D 25 HYDROXY (VIT D DEFICIENCY, FRACTURES): VITD: 17.14 ng/mL — ABNORMAL LOW (ref 30.00–100.00)

## 2024-05-01 MED ORDER — ESTROGENS CONJUGATED 0.625 MG/GM VA CREA: 1.0000 | TOPICAL_CREAM | VAGINAL | 1 refills | Status: AC

## 2024-05-01 MED ORDER — CALCIUM CARB-CHOLECALCIFEROL 600-5 MG-MCG PO TABS: 1.0000 | ORAL_TABLET | Freq: Every day | ORAL | 1 refills | Status: AC

## 2024-05-01 NOTE — Progress Notes (Signed)
 Subjective:    Patient ID: Denise Rhodes, female    DOB: May 08, 1941, 83 y.o.   MRN: [id number]   Denise Rhodes is a very pleasant 83 y.o. female who presents today for follow-up of chronic conditions. Denies any acute concerns today.   Review of Systems  All other systems reviewed and are negative.       No Known Allergies  Current Outpatient Medications on File Prior to Visit  Medication Sig Dispense Refill   Acetylcysteine (NAC PO) Take by mouth.     ALPRAZolam  (XANAX ) 0.25 MG tablet Take 1 tablet (0.25 mg total) by mouth 3 (three) times daily as needed. 30 tablet 0   Cranberry 400 MG CAPS Take by mouth.     MAGNESIUM CITRATE PO Take 1 tablet by mouth daily as needed.     metoprolol  succinate (TOPROL -XL) 25 MG 24 hr tablet Take 1 tablet by mouth once daily 90 tablet 3   Multiple Vitamin (MULTIVITAMIN WITH MINERALS) TABS tablet Take 1 tablet by mouth daily.     No current facility-administered medications on file prior to visit.    BP 138/82   Pulse 68   Temp 98.1 F (36.7 C) (Oral)   Ht 5' 3.75 (1.619 m)   Wt 163 lb 2 oz (74 kg)   SpO2 98%   BMI 28.22 kg/m   Objective:    Physical Exam Vitals and nursing note reviewed.  Constitutional:      Appearance: Normal appearance.  HENT:     Head: Normocephalic and atraumatic.  Eyes:     Extraocular Movements: Extraocular movements intact.     Conjunctiva/sclera: Conjunctivae normal.  Skin:    General: Skin is warm.  Neurological:     Mental Status: She is alert.  Psychiatric:        Mood and Affect: Mood normal.        Behavior: Behavior normal.            Assessment & Plan:   1. Localized osteoporosis without current pathological fracture (Primary) Per chart review, patient last had DEXA scan in 2014 which showed osteoporosis of the spine, reportedly was treated for a brief amount of time, unclear which medication was used at that time.  Will reorder bone scan for monitoring, order vitamin D  level.   In the meantime, I have started patient on calcium -vitamin D  supplement, counseled about increasing intake of dairy rich foods.  Further counseled about the importance of weightbearing exercises such as wall Pilates or walking on an incline.  - DG Bone Density; Future - VITAMIN D  25 Hydroxy (Vit-D Deficiency, Fractures) - Calcium  Carb-Cholecalciferol  (CALCIUM  + VITAMIN D3) 600-5 MG-MCG TABS; Take 1 tablet by mouth daily at 6 (six) AM.  Dispense: 90 tablet; Refill: 1  2. Anxiety Patient has a history of anxiety, however, reportedly not having daily symptoms.  Has been using Xanax  0.25 mg as needed, reportedly goes several weeks at a time without needing it.  Extensively discussed with patient about the side effects of benzodiazepines in patients her age which include but are not limited to increased risk of cardiorespiratory suppression, dizziness/drowsiness/falls, physiologic dependence.  Further discussed risks of polypharmacy in general and the importance of addressing it. Extensively discussed about safer alternative such as BuSpar which can be used for her anxiety. Plan would be to discontinue use completely, given intermittent use, no risk for withdrawal. Patient would like to think about things and we discussed at a later visit.  3. Vaginal dryness,  menopausal Symptoms are periodic, will send vaginal estrogen cream for 3 times weekly use as needed.  - conjugated estrogens  (PREMARIN ) vaginal cream; Place 1 Applicatorful vaginally 3 (three) times a week.  Dispense: 30 g; Refill: 1  4. Need for influenza vaccination - Flu vaccine HIGH DOSE PF(Fluzone Trivalent)    Return in about 4 weeks (around 05/29/2024) for CPE.   Denise Branscome K Nidia Grogan, MD  05/01/24

## 2024-05-01 NOTE — Patient Instructions (Addendum)
 Thank you for visiting [facility] today! Here's what we talked about: - Vaginal cream 3 times weekly - Weight bearing exercises walking incline or wall pilates - Start calcium -vit D supplement daily - They will call you for the bone scan

## 2024-05-02 ENCOUNTER — Ambulatory Visit: Payer: Self-pay

## 2024-05-04 ENCOUNTER — Telehealth: Payer: Self-pay

## 2024-05-04 NOTE — Telephone Encounter (Signed)
 Spoke to pt's husband per DPR. Pharmacy advised him to get OTC but did not direct him as to what he should get. Please advise.

## 2024-05-04 NOTE — Telephone Encounter (Signed)
 Please call patient and find out whether she was able to pick up the calcium  and Vit D supplements I prescribed or if she had to get them herself over-the-counter.  If she got them over-the-counter, please ask her to tell you how much calcium  it contains and how much vitamin D  3 it contains so I can tell her how much to take

## 2024-05-05 NOTE — Telephone Encounter (Signed)
 Sent them a MyChart message

## 2024-05-05 NOTE — Telephone Encounter (Signed)
 He found a bottle of Vitamin D3 5000 international. Is that adequate? How much calcium  does he need to get?

## 2024-05-05 NOTE — Telephone Encounter (Signed)
 Copied from CRM [id number]. Topic: Clinical - Medication Question >> May 04, 2024  5:48 PM Dedra B wrote: Reason for CRM: Pt returning call for [facility] regarding vitamin D  and calcium  supplements.

## 2024-05-06 NOTE — Telephone Encounter (Signed)
 Please call patient and advise as follows: He needs to get the 1200 mg calcium  tablets and she will take 1 tablet daily along with 1 tablet of the vitamin D , the 5000 units daily is fine.

## 2024-05-07 ENCOUNTER — Ambulatory Visit

## 2024-05-07 NOTE — Telephone Encounter (Signed)
 Spoke to pt's husband. He did mention that calcium  makes her constipated. I suggested she start taking Miralax daily.

## 2024-05-07 NOTE — Addendum Note (Signed)
 Addended by: KALLIE CLOTILDA SQUIBB on: 05/07/2024 12:32 PM   Modules accepted: Orders

## 2024-05-11 DIAGNOSIS — R233 Spontaneous ecchymoses: Secondary | ICD-10-CM | POA: Diagnosis not present

## 2024-05-11 DIAGNOSIS — D229 Melanocytic nevi, unspecified: Secondary | ICD-10-CM | POA: Diagnosis not present

## 2024-05-11 DIAGNOSIS — L820 Inflamed seborrheic keratosis: Secondary | ICD-10-CM | POA: Diagnosis not present

## 2024-05-11 DIAGNOSIS — L814 Other melanin hyperpigmentation: Secondary | ICD-10-CM | POA: Diagnosis not present

## 2024-05-29 ENCOUNTER — Ambulatory Visit

## 2024-05-29 VITALS — BP 138/88 | HR 57 | Temp 97.7°F | Ht 63.25 in | Wt 158.0 lb

## 2024-05-29 DIAGNOSIS — Z0000 Encounter for general adult medical examination without abnormal findings: Secondary | ICD-10-CM | POA: Diagnosis not present

## 2024-05-29 DIAGNOSIS — Z136 Encounter for screening for cardiovascular disorders: Secondary | ICD-10-CM | POA: Diagnosis not present

## 2024-05-29 DIAGNOSIS — F411 Generalized anxiety disorder: Secondary | ICD-10-CM | POA: Diagnosis not present

## 2024-05-29 DIAGNOSIS — Z23 Encounter for immunization: Secondary | ICD-10-CM | POA: Diagnosis not present

## 2024-05-29 MED ORDER — CITALOPRAM HYDROBROMIDE 10 MG PO TABS: 10.0000 mg | ORAL_TABLET | Freq: Every day | ORAL | 3 refills | Status: AC

## 2024-05-29 NOTE — Patient Instructions (Addendum)
 Thank you for visiting [facility] today! Here's what we talked about: - Call: [phone] for bone scan  - RSV, and Shingles vaccines from pharmacy - Start Calcium  supplement 1200mg , if constipated go to 600mg  - Start Vit D 2000 units daily -Try Kombucha drink as a natural probiotic

## 2024-05-29 NOTE — Progress Notes (Signed)
 Assessment & Plan:   This visit was conducted in person. The patient gave informed consent to the use of Abridge AI technology to record the contents of the encounter as documented below.  Assessment & Plan 1. Physical exam, annual (Primary) 2. Need for vaccination against Streptococcus pneumoniae 3. Screening for cardiovascular condition Routine wellness visit for health maintenance and preventive care. Discussed age-appropriate screenings and vaccinations. Reviewed lifestyle factors including diet and exercise. Aged out of colonoscopy.  - Discontinue mammograms as last one in November 2024 was normal and she is beyond age recommendation. - Order fasting cholesterol lab test in one week. - Administer Prevnar (pneumonia vaccine) today. - Administer RSV and shingles and tetanus vaccines at pharmacy. - Schedule annual physical for May 29, 2025. - Start calcium  supplement at 1200 mg daily; reduce to 600 mg if constipation occurs. - Continue vitamin D  supplementation at 2000 IU daily. - Encourage increased dairy intake for bone health. - Recommend weight-bearing exercises such as walking on an incline or using stairs. - Schedule bone scan at [facility]; provide phone number for central scheduling.   4. Generalized anxiety disorder Chronic anxiety with infrequent Xanax  use. Discussed Xanax  risks and alternative treatments. Agreed to trial daily medication for anxiety management. Discussed Celexa side effects and need for gradual discontinuation if stopping.  Hopeful that long-term treatment of anxiety will result in fewer and far between Xanax  use.  - Start Celexa 10 mg daily with food. - Continue Xanax  as needed for acute anxiety episodes. - Follow up in four weeks to assess response to Celexa.    Follow-up: Return in about 4 weeks (around 06/26/2024) for Mood, fasting lab visit one wk from now. 05/29/2025 for CPE.        Subjective:   Patient ID: Denise Rhodes,  female    DOB: 23-Jul-1941  Age: 83 y.o. MRN: [id number]  Patient Care Team: Bennett Reuben POUR, MD as PCP - General (Family Medicine) Perla Evalene PARAS, MD as Consulting Physician (Cardiology)   CC:  Chief Complaint  Patient presents with   Annual Exam    Here with husband.       Denise Rhodes is a 83 y.o. female who presents today for a complete physical exam.   Discussed the use of AI scribe software for clinical note transcription with the patient, who gave verbal consent to proceed.  History of Present Illness Denise Rhodes is an 83 year old female who presents for a physical.  She has a history of osteoporosis and low bone density. She is not taking calcium  supplements due to concerns about constipation but takes a daily multivitamin and 5000 units of vitamin D3. She describes herself as active but does not engage in structured exercise routines.  She has a history of anxiety and uses Xanax  as needed, approximately once a month, for episodes of palpitations and anxiety. She experiences palpitations and anxiety, particularly under stress, and attributes these to being a 'worrier.' She has a history of atrial tachycardia and is currently on metoprolol  25 mg. She is open to trying an alternative medication for anxiety.  She has a history of shingles and wants to receive the shingles vaccine to prevent recurrence. She has previously had shingles and does not want to experience it again.  She maintains a diet rich in fruits, vegetables, and dairy. She describes herself as active but not engaging in structured exercise routines. She sleeps well and has no recent falls. She has a history of  receiving vaccines, including the flu shot and pneumonia vaccine, and is considering the RSV vaccine. She last received a tetanus booster in 2008 and is due for another.  She has a history of constipation and manages it with over-the-counter remedies such as stool softeners and milk of magnesia. She is  aware of the benefits of probiotics for gut health.  Advanced Directives Full Code for now   Depression Screening;    05/29/2024   11:32 AM 05/01/2024    9:51 AM 05/07/2023    8:55 AM 05/07/2023    8:32 AM 05/01/2022   10:57 AM 04/12/2021   11:50 AM 01/15/2020   10:41 AM  PHQ 2/9 Scores  PHQ - 2 Score 0 0 1 1 0 0 0  PHQ- 9 Score  0          Anxiety Screening:    05/01/2024    9:51 AM  GAD 7 : Generalized Anxiety Score  Nervous, Anxious, on Edge 0  Control/stop worrying 0  Worry too much - different things 0  Trouble relaxing 0  Restless 0  Easily annoyed or irritable 0  Afraid - awful might happen 0  Total GAD 7 Score 0     ROS: Negative unless specifically indicated above in HPI.       Objective:     BP 138/88 (BP Location: Left Arm, Patient Position: Sitting, Cuff Size: Normal)   Pulse (!) 57   Temp 97.7 F (36.5 C) (Oral)   Ht 5' 3.25 (1.607 m)   Wt 158 lb (71.7 kg)   SpO2 100%   BMI 27.77 kg/m    Physical Exam  GENERAL: Alert, cooperative, well developed, no acute distress. HEAD: Normocephalic atraumatic. EYES: Extraocular movements intact bilaterally, pupils round, equal and reactive to light bilaterally, conjunctivae normal bilaterally, vision grossly intact. EARS: Cerumen impaction in the right ear, tympanic membrane, ear canal and external ear normal bilaterally. NOSE: No congestion or rhinorrhea, mucous membranes are moist. THROAT: Pharynx normal, no oropharyngeal exudate or posterior oropharyngeal erythema. CARDIOVASCULAR: Normal heart rate and rhythm, S1 and S2 normal without murmurs. CHEST: Clear to auscultation bilaterally, no wheezes, rhonchi, or crackles. ABDOMEN: Soft, non-tender, non-distended, without organomegaly, normal bowel sounds. EXTREMITIES: No cyanosis.  Varicose veins present mild pitting edema, left slightly worse than right. NEUROLOGICAL: Oriented to person, place and time, no gait abnormalities, moves all extremities without  gross motor or sensory deficit. NECK: Neck supple, no thyromegaly.      Denise Rhodes K Faron Whitelock, MD  05/29/24    Contains text generated by [employer].

## 2024-06-05 ENCOUNTER — Other Ambulatory Visit (INDEPENDENT_AMBULATORY_CARE_PROVIDER_SITE_OTHER)

## 2024-06-05 DIAGNOSIS — Z136 Encounter for screening for cardiovascular disorders: Secondary | ICD-10-CM | POA: Diagnosis not present

## 2024-06-05 LAB — LIPID PANEL
Cholesterol: 172 mg/dL (ref 0–200)
HDL: 58.2 mg/dL (ref >39.00)
LDL Cholesterol: 97 mg/dL (ref 0–99)
NonHDL: 113.84
Total CHOL/HDL Ratio: 3
Triglycerides: 84 mg/dL (ref 0.0–149.0)
VLDL: 16.8 mg/dL (ref 0.0–40.0)

## 2024-06-09 ENCOUNTER — Ambulatory Visit: Payer: Self-pay

## 2024-06-19 ENCOUNTER — Other Ambulatory Visit: Payer: Self-pay | Admitting: Cardiovascular Disease

## 2024-07-07 ENCOUNTER — Ambulatory Visit: Admission: RE | Admit: 2024-07-07 | Discharge: 2024-07-07 | Disposition: A | Source: Ambulatory Visit

## 2024-07-07 DIAGNOSIS — M816 Localized osteoporosis [Lequesne]: Secondary | ICD-10-CM | POA: Insufficient documentation

## 2024-07-07 DIAGNOSIS — M8589 Other specified disorders of bone density and structure, multiple sites: Secondary | ICD-10-CM | POA: Diagnosis not present

## 2024-07-07 DIAGNOSIS — Z780 Asymptomatic menopausal state: Secondary | ICD-10-CM | POA: Diagnosis not present

## 2024-07-16 ENCOUNTER — Ambulatory Visit

## 2024-07-16 NOTE — Progress Notes (Deleted)
° °  Subjective:   This visit was conducted in person. The patient gave informed consent to the use of Abridge AI technology to record the contents of the encounter as documented below.   Patient ID: Denise Rhodes, female    DOB: 12/07/1940, 83 y.o.   MRN: [id number]   Discussed the use of AI scribe software for clinical note transcription with the patient, who gave verbal consent to proceed.  History of Present Illness   Celexa , how often xanax  DISCUSS osteopenia discussed tx vs monitor    Review of Systems      Allergies[1]  Medications Ordered Prior to Encounter[2]  There were no vitals taken for this visit.  Objective:      Physical Exam          Assessment & Plan:   Assessment and Plan Assessment & Plan       No follow-ups on file.   Dave Mannes K Ogechi Kuehnel, MD  07/16/2024     Contains text generated by Abridge.       [1] No Known Allergies [2]  Current Outpatient Medications on File Prior to Visit  Medication Sig Dispense Refill   Acetylcysteine (NAC PO) Take by mouth.     ALPRAZolam  (XANAX ) 0.25 MG tablet Take 1 tablet (0.25 mg total) by mouth 3 (three) times daily as needed. 30 tablet 0   Calcium  Carb-Cholecalciferol  (CALCIUM  + VITAMIN D3) 600-5 MG-MCG TABS Take 1 tablet by mouth daily at 6 (six) AM. 90 tablet 1   Cholecalciferol  (VITAMIN D3) 125 MCG (5000 UT) CAPS Take 1 capsule by mouth daily.     citalopram  (CELEXA ) 10 MG tablet Take 1 tablet (10 mg total) by mouth daily. 30 tablet 3   conjugated estrogens  (PREMARIN ) vaginal cream Place 1 Applicatorful vaginally 3 (three) times a week. 30 g 1   Cranberry 400 MG CAPS Take by mouth.     MAGNESIUM CITRATE PO Take 1 tablet by mouth daily as needed.     metoprolol  succinate (TOPROL -XL) 25 MG 24 hr tablet Take 1 tablet by mouth once daily 30 tablet 0   Multiple Vitamin (MULTIVITAMIN WITH MINERALS) TABS tablet Take 1 tablet by mouth daily.     No current facility-administered medications on file prior to  visit.

## 2024-07-20 ENCOUNTER — Other Ambulatory Visit: Payer: Self-pay | Admitting: Cardiovascular Disease

## 2024-07-24 ENCOUNTER — Ambulatory Visit

## 2024-07-24 ENCOUNTER — Ambulatory Visit: Payer: Self-pay

## 2024-07-24 VITALS — BP 140/86 | HR 68 | Temp 98.1°F | Ht 63.25 in | Wt 160.5 lb

## 2024-07-24 DIAGNOSIS — J020 Streptococcal pharyngitis: Secondary | ICD-10-CM | POA: Diagnosis not present

## 2024-07-24 DIAGNOSIS — R051 Acute cough: Secondary | ICD-10-CM

## 2024-07-24 LAB — POCT RAPID STREP A (OFFICE): Rapid Strep A Screen: POSITIVE — AB

## 2024-07-24 MED ORDER — PENICILLIN V POTASSIUM 500 MG PO TABS: 500.0000 mg | ORAL_TABLET | Freq: Two times a day (BID) | ORAL | 0 refills | Status: AC

## 2024-07-24 MED ORDER — PROMETHAZINE-DM 6.25-15 MG/5ML PO SYRP: 5.0000 mL | ORAL_SOLUTION | Freq: Every evening | ORAL | 0 refills | Status: AC

## 2024-07-24 NOTE — Progress Notes (Signed)
 "  Subjective:   This visit was conducted in person. The patient gave informed consent to the use of Abridge AI technology to record the contents of the encounter as documented below.   Patient ID: Denise Rhodes, female    DOB: 04/20/1941, 83 y.o.   MRN: [id number]   Discussed the use of AI scribe software for clinical note transcription with the patient, who gave verbal consent to proceed.  History of Present Illness Denise Rhodes is an 83 year old female who presents with a two-week history of cough and sore throat.  She has been experiencing a persistent cough and sore throat for two weeks, which began after returning from a Thanksgiving visit. The cough was initially worse but has slightly improved. The sore throat has been severe enough to occasionally require ibuprofen for relief, which she has taken twice. She has been using Mucinex, which she feels has provided some relief.  No headaches, runny nose, nasal congestion, body aches, fever, chills, or difficulty breathing. Her symptoms have been fluctuating, with some days showing improvement and others reverting to previous severity. The cough does not disturb her sleep, and she sleeps well once she gets past coughing fits.  During a recent visit to her daughter's home, she was exposed to her grandchildren, who were coughing, particularly one named Gracie. She noticed white patches in her throat.  She has been taking Celexa  for anxiety, which she feels has been effective without any adverse effects. She takes it every morning and reports feeling less anxious.   Review of Systems  All other systems reviewed and are negative.       Allergies[1]  Medications Ordered Prior to Encounter[2]  BP (!) 146/94 (BP Location: Left Arm, Patient Position: Sitting, Cuff Size: Normal)   Pulse 68   Temp 98.1 F (36.7 C) (Oral)   Ht 5' 3.25 (1.607 m)   Wt 160 lb 8 oz (72.8 kg)   SpO2 95%   BMI 28.21 kg/m   Objective:      Physical  Exam  GENERAL: Alert, cooperative, well developed, no acute distress. HEAD: Normocephalic atraumatic. EYES: Extraocular movements intact bilaterally, pupils round, equal and reactive to light bilaterally, conjunctivae normal bilaterally. EARS: Cerumen impaction bilaterally. NOSE: No rhinorrhea or nasal discharge, mucous membranes are moist. THROAT: Posterior oropharyngeal erythema. CARDIOVASCULAR: Normal heart rate and rhythm, S1 and S2 normal without murmurs. CHEST: Clear to auscultation bilaterally, no wheezes, rhonchi, or crackles. NECK: Mild cervical lymphadenopathy.         Assessment & Plan:   Assessment & Plan Streptococcal pharyngitis Cough Positive strep test with posterior oropharyngeal erythema and mild cervical lymphadenopathy. Cough likely viral, possibly post-viral bronchitis. No fever or respiratory distress.  - Prescribed penicillin V 1 tablet twice a day for 10 days. - Prescribed promethazine cough syrup 5 mL nightly for 10 days for nighttime cough. - Advised hand hygiene and infection precautions during social gatherings.  Generalized anxiety disorder Anxiety symptoms improved with Celexa . No adverse side effects reported. - Continue Celexa  as currently prescribed.    CPE scheduled for Oct 2026  Kinza Gouveia K Kenai Fluegel, MD  07/24/2024     Contains text generated by Abridge.         [1] No Known Allergies [2]  Current Outpatient Medications on File Prior to Visit  Medication Sig Dispense Refill   Acetylcysteine (NAC PO) Take by mouth.     ALPRAZolam  (XANAX ) 0.25 MG tablet Take 1 tablet (0.25 mg total) by mouth 3 (three) times daily  as needed. 30 tablet 0   Calcium  Carb-Cholecalciferol  (CALCIUM  + VITAMIN D3) 600-5 MG-MCG TABS Take 1 tablet by mouth daily at 6 (six) AM. 90 tablet 1   Cholecalciferol  (VITAMIN D3) 125 MCG (5000 UT) CAPS Take 1 capsule by mouth daily.     citalopram  (CELEXA ) 10 MG tablet Take 1 tablet (10 mg total) by mouth daily. 30 tablet 3    conjugated estrogens  (PREMARIN ) vaginal cream Place 1 Applicatorful vaginally 3 (three) times a week. 30 g 1   Cranberry 400 MG CAPS Take by mouth.     MAGNESIUM CITRATE PO Take 1 tablet by mouth daily as needed.     metoprolol  succinate (TOPROL -XL) 25 MG 24 hr tablet TAKE 1 TABLET BY MOUTH ONCE DAILY (MUST  SCHEDULE  A  FOLLOW  UP  APPOINTMENT  WITH  CARDIOLOGY) 15 tablet 0   Multiple Vitamin (MULTIVITAMIN WITH MINERALS) TABS tablet Take 1 tablet by mouth daily.     No current facility-administered medications on file prior to visit.   "

## 2024-07-24 NOTE — Patient Instructions (Signed)
 Thank you for visiting [facility] today! Here's what we talked about: - START Syrup only at night, take penicillin

## 2024-07-24 NOTE — Telephone Encounter (Signed)
" °  FYI Only or Action Required?: Action required by provider: request for appointment.  Patient was last seen in primary care on 05/29/2024 by Bennett Reuben POUR, MD.  Called Nurse Triage reporting Sore Throat.  Symptoms began several weeks ago.  Interventions attempted: Rest, hydration, or home remedies.  Symptoms are: gradually worsening. Sore throat with white patches in back. Has runny nose.  Triage Disposition: See Physician Within 24 Hours  Patient/caregiver understands and will follow disposition?: Yes     Copied from CRM #[id number]. Topic: Clinical - Red Word Triage >> Jul 24, 2024  8:51 AM Berwyn MATSU wrote: Red Word that prompted transfer to Nurse Triage: sore throat with white patches, cough and congestion can't get rid of it for almost 2 weeks. Reason for Disposition  SEVERE throat pain (e.g., excruciating)  Answer Assessment - Initial Assessment Questions 1. ONSET: When did the throat start hurting? (Hours or days ago)      2 weeks 2. SEVERITY: How bad is the sore throat? (Scale 1-10; mild, moderate or severe)     moderate 3. STREP EXPOSURE: Has there been any exposure to strep within the past week? If Yes, ask: What type of contact occurred?      no 4.  VIRAL SYMPTOMS: Are there any symptoms of a cold, such as a runny nose, cough, hoarse voice or red eyes?      Yes - cough 5. FEVER: Do you have a fever? If Yes, ask: What is your temperature, how was it measured, and when did it start?     no 6. PUS ON THE TONSILS: Is there pus on the tonsils in the back of your throat?     yes 7. OTHER SYMPTOMS: Do you have any other symptoms? (e.g., difficulty breathing, headache, rash)     no 8. PREGNANCY: Is there any chance you are pregnant? When was your last menstrual period?     no  Protocols used: Sore Throat-A-AH  "

## 2024-07-25 ENCOUNTER — Ambulatory Visit: Payer: Self-pay

## 2024-08-05 ENCOUNTER — Ambulatory Visit: Payer: Self-pay

## 2024-08-05 NOTE — Telephone Encounter (Signed)
 FYI Only or Action Required?: FYI only for provider: Urgent Care Advised.  Patient was last seen in primary care on 07/24/2024 by Denise Reuben POUR, MD.  Called Nurse Triage reporting Sore Throat, Cough, and Nasal Congestion.  Symptoms began today.  Sore throat has returned after going away.   Interventions attempted: Prescription medications: has completed antibiotic course as of Monday and Other: plans to drink more fluids.  Symptoms are: returning.  Triage Disposition: See Physician Within 24 Hours  Patient/caregiver understands and will follow disposition?: Yes    Copied from CRM #[id number]. Topic: Clinical - Red Word Triage >> Aug 05, 2024  4:32 PM Taleah C wrote: Red Word that prompted transfer to Nurse Triage: severe throat pain, was prescribed medicine and sxs cleared but it has came back. Still has cough and congestion.    Reason for Disposition  [1] Taking antibiotic > 72 hours (3 days) for strep throat AND [2] sore throat not improved    Sore throat improved but then returned today, 4/10.  Additional Information  [1] Diagnosed strep throat AND [2] taking antibiotic AND [3] symptoms continue    Last day of antibiotic was Monday this week.  Answer Assessment - Initial Assessment Questions 1. ONSET: When did the throat start hurting? (Hours or days ago)      Sore throat returned again this AM when awakening.  Was not present for a few days.  Completed recent antibiotic for Strep Throat, last dose was Monday.   2. SEVERITY: How bad is the sore throat? (Scale 1-10; mild, moderate or severe)     4/10, no trouble swallowing or excessive salivation.  No trouble moving neck, neck pain or stiffness.   3. STREP EXPOSURE: Has there been any exposure to strep within the past week? If Yes, ask: What type of contact occurred?      Recent positive strep test- was treated with penicillin .  Symptoms were better last few days but sore throat returned this AM.  Took 10 day dose of  Penicillin .   Finally felt better after about a week on medication.   4.  VIRAL SYMPTOMS: Are there any symptoms of a cold, such as a runny nose, cough, hoarse voice or red eyes?      Denies  5. FEVER: Do you have a fever? If Yes, ask: What is your temperature, how was it measured, and when did it start?     Denies fever/ chills in last 24 hours.   6. PUS ON THE TONSILS: Is there pus on the tonsils in the back of your throat?     Does not have tonsils- emergency surgery removal as baby.   7. OTHER SYMPTOMS: Do you have any other symptoms? (e.g., difficulty breathing, headache, rash)     Mild cough at night, congestion, drainage back of throat; coughing subsided quite a bit; runny nose this AM went away pretty quick.  Congestion was not a major concern and is not a concern now.    Other history- takes metoprolol  for atrial tachycardia history which has been greatly effective, history of prolapsed valve (no infection reported).   Reports might be dehydrated but is urinating normally and denies dizziness or light-headedness.  Answer Assessment - Initial Assessment Questions See alternative assessment.   BETTER-SAME-WORSE: Are you getting better, staying the same, or getting worse compared to the day you started the antibiotics?     Was feeling better overall, felt pretty good, concerned about returning sore throat.  Protocols used: Sore Throat-A-AH,  Strep Throat Infection on Antibiotic Follow-up Call-A-AH

## 2024-08-07 NOTE — Telephone Encounter (Signed)
 Left message on VM to see how pt was doing and if she went somewhere to be seen.

## 2024-08-11 NOTE — Telephone Encounter (Signed)
 LM for pt to return my call

## 2024-08-14 NOTE — Telephone Encounter (Signed)
 LMTCB

## 2024-09-04 ENCOUNTER — Ambulatory Visit

## 2024-09-04 VITALS — Ht 63.0 in | Wt 160.0 lb

## 2024-09-04 DIAGNOSIS — Z0000 Encounter for general adult medical examination without abnormal findings: Secondary | ICD-10-CM | POA: Diagnosis not present

## 2024-09-04 NOTE — Patient Instructions (Addendum)
 Denise Rhodes,  Thank you for taking the time for your Medicare Wellness Visit. I appreciate your continued commitment to your health goals. Please review the care plan we discussed, and feel free to reach out if I can assist you further.  Please note that Annual Wellness Visits do not include a physical exam. Some assessments may be limited, especially if the visit was conducted virtually. If needed, we may recommend an in-person follow-up with your provider.  Ongoing Care Seeing your primary care provider every 3 to 6 months helps us  monitor your health and provide consistent, personalized care.   Referrals If a referral was made during today's visit and you haven't received any updates within two weeks, please contact the referred provider directly to check on the status.  Recommended Screenings:  Health Maintenance  Topic Date Due   DTaP/Tdap/Td vaccine (2 - Tdap) 05/29/2025*   COVID-19 Vaccine (4 - 2025-26 season) 05/29/2025*   Zoster (Shingles) Vaccine (1 of 2) 05/29/2025*   Breast Cancer Screening  06/10/2025   Medicare Annual Wellness Visit  09/04/2025   Pneumococcal Vaccine for age over 84  Completed   Flu Shot  Completed   Osteoporosis screening with Bone Density Scan  Completed   Meningitis B Vaccine  Aged Out  *Topic was postponed. The date shown is not the original due date.       09/04/2024    8:12 AM  Advanced Directives  Does Patient Have a Medical Advance Directive? No  Would patient like information on creating a medical advance directive? No - Patient declined    Vision: Annual vision screenings are recommended for early detection of glaucoma, cataracts, and diabetic retinopathy. These exams can also reveal signs of chronic conditions such as diabetes and high blood pressure.  Dental: Annual dental screenings help detect early signs of oral cancer, gum disease, and other conditions linked to overall health, including heart disease and diabetes.

## 2024-09-04 NOTE — Progress Notes (Signed)
 "  Chief Complaint  Patient presents with   Medicare Wellness     Subjective:   Denise Rhodes is a 84 y.o. female who presents for a Medicare Annual Wellness Visit.  Visit info / Clinical Intake: Medicare Wellness Visit Type:: Subsequent Annual Wellness Visit Persons participating in visit and providing information:: patient Medicare Wellness Visit Mode:: Telephone If telephone:: video declined Since this visit was completed virtually, some vitals may be partially provided or unavailable. Missing vitals are due to the limitations of the virtual format.: Documented vitals are patient reported If Telephone or Video please confirm:: I connected with patient using audio/video enable telemedicine. I verified patient identity with two identifiers, discussed telehealth limitations, and patient agreed to proceed. Patient Location:: Home Provider Location:: Office Interpreter Needed?: No Pre-visit prep was completed: yes AWV questionnaire completed by patient prior to visit?: no Living arrangements:: lives with spouse/significant other Patient's Overall Health Status Rating: good Typical amount of pain: none Does pain affect daily life?: no Are you currently prescribed opioids?: no  Dietary Habits and Nutritional Risks How many meals a day?: 3 Eats fruit and vegetables daily?: yes Most meals are obtained by: preparing own meals; eating out In the last 2 weeks, have you had any of the following?: none Diabetic:: no  Functional Status Activities of Daily Living (to include ambulation/medication): Independent Ambulation: Independent with device- listed below Home Assistive Devices/Equipment: Eyeglasses Medication Administration: Independent Home Management (perform basic housework or laundry): Independent Manage your own finances?: yes Primary transportation is: driving; family / friends Concerns about vision?: no *vision screening is required for WTM* Concerns about hearing?:  no  Fall Screening Falls in the past year?: 0 Number of falls in past year: 0 Was there an injury with Fall?: 0 Fall Risk Category Calculator: 0 Patient Fall Risk Level: Low Fall Risk  Fall Risk Patient at Risk for Falls Due to: No Fall Risks Fall risk Follow up: Falls evaluation completed; Falls prevention discussed  Home and Transportation Safety: All rugs have non-skid backing?: N/A, no rugs All stairs or steps have railings?: yes Grab bars in the bathtub or shower?: yes (bar in shower) Have non-skid surface in bathtub or shower?: yes Good home lighting?: yes Regular seat belt use?: yes Hospital stays in the last year:: no  Cognitive Assessment Difficulty concentrating, remembering, or making decisions? : yes (Spouse helps) Will 6CIT or Mini Cog be Completed: yes What year is it?: 0 points What month is it?: 0 points Give patient an address phrase to remember (5 components): [address] [place], [place] About what time is it?: 0 points Count backwards from 20 to 1: 0 points Say the months of the year in reverse: 0 points Repeat the address phrase from earlier: 0 points 6 CIT Score: 0 points  Advance Directives (For Healthcare) Does Patient Have a Medical Advance Directive?: No Would patient like information on creating a medical advance directive?: No - Patient declined  Reviewed/Updated  Reviewed/Updated: Reviewed All (Medical, Surgical, Family, Medications, Allergies, Care Teams, Patient Goals)    Allergies (verified) Patient has no known allergies.   Current Medications (verified) Outpatient Encounter Medications as of 09/04/2024  Medication Sig   Acetylcysteine (NAC PO) Take by mouth.   ALPRAZolam  (XANAX ) 0.25 MG tablet Take 1 tablet (0.25 mg total) by mouth 3 (three) times daily as needed.   Calcium  Carb-Cholecalciferol  (CALCIUM  + VITAMIN D3) 600-5 MG-MCG TABS Take 1 tablet by mouth daily at 6 (six) AM.   Cholecalciferol  (VITAMIN D3) 125 MCG (  5000 UT) CAPS  Take 1 capsule by mouth daily.   citalopram  (CELEXA ) 10 MG tablet Take 1 tablet (10 mg total) by mouth daily.   Cranberry 400 MG CAPS Take by mouth.   MAGNESIUM CITRATE PO Take 1 tablet by mouth daily as needed.   metoprolol  succinate (TOPROL -XL) 25 MG 24 hr tablet TAKE 1 TABLET BY MOUTH ONCE DAILY (MUST  SCHEDULE  A  FOLLOW  UP  APPOINTMENT  WITH  CARDIOLOGY)   Multiple Vitamin (MULTIVITAMIN WITH MINERALS) TABS tablet Take 1 tablet by mouth daily.   No facility-administered encounter medications on file as of 09/04/2024.    History: Past Medical History:  Diagnosis Date   Acute upper respiratory infections of unspecified site    Anxiety state, unspecified    Esophagitis 7/15   and gastritis on EGD-- Dr Gaylyn   Gilbert's syndrome    H/O: pneumonia 09/2006   IBS (irritable bowel syndrome)    Mitral valve disorders(424.0)    Osteoporosis, unspecified    Urinary tract infection, site not specified    Past Surgical History:  Procedure Laterality Date   Bladder tack     CATARACT EXTRACTION, BILATERAL  9/12, 1/13   MINOR EXCISION OF ORAL LESION     TOTAL ABDOMINAL HYSTERECTOMY W/ BILATERAL SALPINGOOPHORECTOMY  2000   Vaginectomy colpocleisis cysto  2/16   Family History  Problem Relation Age of Onset   Cancer Father        Lung   Cancer Mother        Colon   Hypertension Mother    Diabetes Other        Paternal side   Coronary artery disease Maternal Grandmother    Coronary artery disease Paternal Grandmother    Social History   Occupational History   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  Tobacco Use   Smoking status: Never    Passive exposure: Never   Smokeless tobacco: Never  Vaping Use   Vaping status: Never Used  Substance and Sexual Activity   Alcohol use: No    Alcohol/week: 0.0 standard drinks of alcohol    Comment: Rare   Drug use: No   Sexual activity: Yes   Tobacco Counseling Counseling given: No  SDOH Screenings   Food Insecurity: No  Food Insecurity (09/04/2024)  Housing: Unknown (09/04/2024)  Transportation Needs: No Transportation Needs (09/04/2024)  Utilities: Not At Risk (09/04/2024)  Depression (PHQ2-9): Low Risk (09/04/2024)  Physical Activity: Inactive (09/04/2024)  Social Connections: Socially Integrated (09/04/2024)  Stress: No Stress Concern Present (09/04/2024)  Tobacco Use: Low Risk (09/04/2024)  Health Literacy: Inadequate Health Literacy (09/04/2024)   See flowsheets for full screening details  Depression Screen PHQ 2 & 9 Depression Scale- Over the past 2 weeks, how often have you been bothered by any of the following problems? Little interest or pleasure in doing things: 0 Feeling down, depressed, or hopeless (PHQ Adolescent also includes...irritable): 0 PHQ-2 Total Score: 0 Trouble falling or staying asleep, or sleeping too much: 0 Feeling tired or having little energy: 0 Poor appetite or overeating (PHQ Adolescent also includes...weight loss): 0 Feeling bad about yourself - or that you are a failure or have let yourself or your family down: 0 Trouble concentrating on things, such as reading the newspaper or watching television (PHQ Adolescent also includes...like school work): 0 Moving or speaking so slowly that other people could have noticed. Or the opposite - being so fidgety or restless that you have been moving around a lot more  than usual: 0 Thoughts that you would be better off dead, or of hurting yourself in some way: 0 PHQ-9 Total Score: 0 If you checked off any problems, how difficult have these problems made it for you to do your work, take care of things at home, or get along with other people?: Not difficult at all  Depression Treatment Depression Interventions/Treatment : Medication; Currently on Treatment; PHQ2-9 Score <4 Follow-up Not Indicated     Goals Addressed               This Visit's Progress     Patient Stated (pt-stated)        Patient stated she plans to continue staying  active and start exercising (walk more)             Objective:    Today's Vitals   09/04/24 0811  Weight: 160 lb (72.6 kg)  Height: 5' 3 (1.6 m)   Body mass index is 28.34 kg/m.  Hearing/Vision screen Hearing Screening - Comments:: Denies hearing difficulties   Vision Screening - Comments:: Wears rx glasses - up to date with routine eye exams with Dr Jaye at [facility] Immunizations and Health Maintenance Health Maintenance  Topic Date Due   DTaP/Tdap/Td (2 - Tdap) 05/29/2025 (Originally 03/19/2017)   COVID-19 Vaccine (4 - 2025-26 season) 05/29/2025 (Originally 04/06/2024)   Zoster Vaccines- Shingrix (1 of 2) 05/29/2025 (Originally 02/10/1960)   Mammogram  06/10/2025   Medicare Annual Wellness (AWV)  09/04/2025   Pneumococcal Vaccine: 50+ Years  Completed   Influenza Vaccine  Completed   Bone Density Scan  Completed   Meningococcal B Vaccine  Aged Out        Assessment/Plan:  This is a routine wellness examination for [place].  Patient Care Team: Bennett Reuben POUR, MD as PCP - General (Family Medicine) Perla Evalene PARAS, MD as Consulting Physician (Cardiology) Jaye Fallow, MD as Referring Physician (Ophthalmology)  I have personally reviewed and noted the following in the patients chart:   Medical and social history Use of alcohol, tobacco or illicit drugs  Current medications and supplements including opioid prescriptions. Functional ability and status Nutritional status Physical activity Advanced directives List of other physicians Hospitalizations, surgeries, and ER visits in previous 12 months Vitals Screenings to include cognitive, depression, and falls Referrals and appointments  No orders of the defined types were placed in this encounter.  In addition, I have reviewed and discussed with patient certain preventive protocols, quality metrics, and best practice recommendations. A written personalized care plan for preventive services as well  as general preventive health recommendations were provided to patient.   Denise Rhodes, CMA   09/04/2024   Return in 1 year (on 09/04/2025).  After Visit Summary: (MyChart) Due to this being a telephonic visit, the after visit summary with patients personalized plan was offered to patient via MyChart   Nurse Notes: scheduled 2027 AWV appt "

## 2024-09-10 ENCOUNTER — Other Ambulatory Visit: Payer: Self-pay | Admitting: Cardiovascular Disease

## 2024-09-10 NOTE — Telephone Encounter (Signed)
 Copied from CRM #[id number]. Topic: Clinical - Medication Question >> Sep 10, 2024  9:20 AM Donna BRAVO wrote: Reason for CRM: patient husband asking for update.This medication was prescribed by Dr Evalene Lunger MD cardiologist. Dr Gollan discharged patient from care and primary care provider can refill the medication Patient is out of medication    Informed Richard  pharmacy sent in request  09/10/24 0916    Reorder Interface, Surescripts Out   metoprolol  succinate (TOPROL -XL) 25 MG 24 hr tablet

## 2025-05-25 ENCOUNTER — Other Ambulatory Visit

## 2025-06-01 ENCOUNTER — Encounter

## 2025-09-06 ENCOUNTER — Ambulatory Visit
